# Patient Record
Sex: Female | Born: 1965 | Race: White | Hispanic: Yes | Marital: Married | Smoking: Never smoker
Health system: Southern US, Community
[De-identification: ages and names within clinical notes are randomized; demographics above are authoritative.]

## PROBLEM LIST (undated history)

## (undated) DIAGNOSIS — T7840XA Allergy, unspecified, initial encounter: Secondary | ICD-10-CM

## (undated) DIAGNOSIS — D259 Leiomyoma of uterus, unspecified: Secondary | ICD-10-CM

## (undated) DIAGNOSIS — M7580 Other shoulder lesions, unspecified shoulder: Secondary | ICD-10-CM

## (undated) DIAGNOSIS — N200 Calculus of kidney: Secondary | ICD-10-CM

## (undated) DIAGNOSIS — B019 Varicella without complication: Secondary | ICD-10-CM

## (undated) HISTORY — DX: Allergy, unspecified, initial encounter: T78.40XA

## (undated) HISTORY — DX: Varicella without complication: B01.9

## (undated) HISTORY — DX: Other shoulder lesions, unspecified shoulder: M75.80

## (undated) HISTORY — DX: Leiomyoma of uterus, unspecified: D25.9

## (undated) HISTORY — PX: LIPOSUCTION: SHX10

## (undated) HISTORY — DX: Calculus of kidney: N20.0

## (undated) HISTORY — PX: DILATION AND CURETTAGE OF UTERUS: SHX78

---

## 2003-05-16 ENCOUNTER — Other Ambulatory Visit: Admission: RE | Admit: 2003-05-16 | Discharge: 2003-05-16 | Payer: Self-pay | Admitting: Obstetrics and Gynecology

## 2004-05-30 ENCOUNTER — Other Ambulatory Visit: Admission: RE | Admit: 2004-05-30 | Discharge: 2004-05-30 | Payer: Self-pay | Admitting: Obstetrics and Gynecology

## 2006-07-15 ENCOUNTER — Emergency Department (HOSPITAL_COMMUNITY): Admission: EM | Admit: 2006-07-15 | Discharge: 2006-07-15 | Payer: Self-pay | Admitting: Emergency Medicine

## 2009-05-20 ENCOUNTER — Ambulatory Visit (HOSPITAL_COMMUNITY): Admission: RE | Admit: 2009-05-20 | Discharge: 2009-05-20 | Payer: Self-pay | Admitting: Obstetrics and Gynecology

## 2013-09-28 ENCOUNTER — Ambulatory Visit: Payer: Self-pay | Admitting: Family Medicine

## 2013-10-09 ENCOUNTER — Ambulatory Visit (INDEPENDENT_AMBULATORY_CARE_PROVIDER_SITE_OTHER): Payer: BC Managed Care – PPO | Admitting: Family Medicine

## 2013-10-09 ENCOUNTER — Encounter: Payer: Self-pay | Admitting: Family Medicine

## 2013-10-09 VITALS — BP 106/70 | HR 88 | Temp 98.6°F | Ht 61.5 in | Wt 116.6 lb

## 2013-10-09 DIAGNOSIS — Z23 Encounter for immunization: Secondary | ICD-10-CM

## 2013-10-09 DIAGNOSIS — Z0000 Encounter for general adult medical examination without abnormal findings: Secondary | ICD-10-CM

## 2013-10-09 NOTE — Progress Notes (Signed)
Pre visit review using our clinic review tool, if applicable. No additional management support is needed unless otherwise documented below in the visit note. 

## 2013-10-09 NOTE — Progress Notes (Signed)
Subjective:     Emily Kirby is a 48 y.o. female and is here for a comprehensive physical exam. The patient reports no problems.  History   Social History  . Marital Status: Married    Spouse Name: N/A    Number of Children: N/A  . Years of Education: N/A   Occupational History  .      court interpreter   Social History Main Topics  . Smoking status: Never Smoker   . Smokeless tobacco: Not on file  . Alcohol Use: Yes  . Drug Use: No  . Sexual Activity: Yes    Partners: Male   Other Topics Concern  . Not on file   Social History Narrative  . No narrative on file   There are no preventive care reminders to display for this patient.  The following portions of the patient's history were reviewed and updated as appropriate:  She  has a past medical history of Chicken pox; Kidney stones; and AC (acromioclavicular) joint bone spurs. She  does not have a problem list on file. She  has past surgical history that includes Liposuction and Cesarean section. Her family history includes COPD in her father; Cancer in her father; Hearing loss in her father; Heart disease in her father; Hypertension in her mother. She  reports that she has never smoked. She does not have any smokeless tobacco history on file. She reports that she drinks alcohol. She reports that she does not use illicit drugs. She has a current medication list which includes the following prescription(s): multiple vitamins-minerals. No current outpatient prescriptions on file prior to visit.   No current facility-administered medications on file prior to visit.   She is allergic to codeine and pseudoephedrine..  Review of Systems Review of Systems  Constitutional: Negative for activity change, appetite change and fatigue.  HENT: Negative for hearing loss, congestion, tinnitus and ear discharge.  dentist q51m Eyes: Negative for visual disturbance (see optho q1y -- vision corrected to 20/20 with glasses).   Respiratory: Negative for cough, chest tightness and shortness of breath.   Cardiovascular: Negative for chest pain, palpitations and leg swelling.  Gastrointestinal: Negative for abdominal pain, diarrhea, constipation and abdominal distention.  Genitourinary: Negative for urgency, frequency, decreased urine volume and difficulty urinating.  Musculoskeletal: Negative for back pain, arthralgias and gait problem.  Skin: Negative for color change, pallor and rash.  Neurological: Negative for dizziness, light-headedness, numbness and headaches.  Hematological: Negative for adenopathy. Does not bruise/bleed easily.  Psychiatric/Behavioral: Negative for suicidal ideas, confusion, sleep disturbance, self-injury, dysphoric mood, decreased concentration and agitation.       Objective:    BP 106/70  Pulse 88  Temp(Src) 98.6 F (37 C) (Oral)  Ht 5' 1.5" (1.562 m)  Wt 116 lb 9.6 oz (52.889 kg)  BMI 21.68 kg/m2  SpO2 96% General appearance: alert, cooperative, appears stated age and no distress Head: Normocephalic, without obvious abnormality, atraumatic Eyes: conjunctivae/corneas clear. PERRL, EOM's intact. Fundi benign. Ears: normal TM's and external ear canals both ears Nose: Nares normal. Septum midline. Mucosa normal. No drainage or sinus tenderness. Throat: lips, mucosa, and tongue normal; teeth and gums normal Neck: no adenopathy, no carotid bruit, no JVD, supple, symmetrical, trachea midline and thyroid not enlarged, symmetric, no tenderness/mass/nodules Back: symmetric, no curvature. ROM normal. No CVA tenderness. Lungs: clear to auscultation bilaterally Breasts: gyn Heart: regular rate and rhythm, S1, S2 normal, no murmur, click, rub or gallop Abdomen: s/p liposuction-- still tender , healing well Pelvic: deferred--gyn  Extremities: extremities normal, atraumatic, no cyanosis or edema Pulses: 2+ and symmetric Skin: Skin color, texture, turgor normal. No rashes or lesions Lymph  nodes: Cervical, supraclavicular, and axillary nodes normal. Neurologic: Alert and oriented X 3, normal strength and tone. Normal symmetric reflexes. Normal coordination and gait Psych-- no depression, no anxiety      Assessment:    Healthy female exam.      Plan:    ghm utd--- Tdap given before See After Visit Summary for Counseling Recommendations   1. Preventative health care  - Basic metabolic panel; Future - CBC with Differential; Future - Hepatic function panel; Future - Lipid panel; Future - POCT urinalysis dipstick; Future - TSH; Future

## 2013-10-09 NOTE — Patient Instructions (Signed)
Preventive Care for Adults A healthy lifestyle and preventive care can promote health and wellness. Preventive health guidelines for women include the following key practices.  A routine yearly physical is a good way to check with your health care provider about your health and preventive screening. It is a chance to share any concerns and updates on your health and to receive a thorough exam.  Visit your dentist for a routine exam and preventive care every 6 months. Brush your teeth twice a day and floss once a day. Good oral hygiene prevents tooth decay and gum disease.  The frequency of eye exams is based on your age, health, family medical history, use of contact lenses, and other factors. Follow your health care provider's recommendations for frequency of eye exams.  Eat a healthy diet. Foods like vegetables, fruits, whole grains, low-fat dairy products, and lean protein foods contain the nutrients you need without too many calories. Decrease your intake of foods high in solid fats, added sugars, and salt. Eat the right amount of calories for you.Get information about a proper diet from your health care provider, if necessary.  Regular physical exercise is one of the most important things you can do for your health. Most adults should get at least 150 minutes of moderate-intensity exercise (any activity that increases your heart rate and causes you to sweat) each week. In addition, most adults need muscle-strengthening exercises on 2 or more days a week.  Maintain a healthy weight. The body mass index (BMI) is a screening tool to identify possible weight problems. It provides an estimate of body fat based on height and weight. Your health care provider can find your BMI and can help you achieve or maintain a healthy weight.For adults 20 years and older:  A BMI below 18.5 is considered underweight.  A BMI of 18.5 to 24.9 is normal.  A BMI of 25 to 29.9 is considered overweight.  A BMI of  30 and above is considered obese.  Maintain normal blood lipids and cholesterol levels by exercising and minimizing your intake of saturated fat. Eat a balanced diet with plenty of fruit and vegetables. Blood tests for lipids and cholesterol should begin at age 76 and be repeated every 5 years. If your lipid or cholesterol levels are high, you are over 50, or you are at high risk for heart disease, you may need your cholesterol levels checked more frequently.Ongoing high lipid and cholesterol levels should be treated with medicines if diet and exercise are not working.  If you smoke, find out from your health care provider how to quit. If you do not use tobacco, do not start.  Lung cancer screening is recommended for adults aged 22-80 years who are at high risk for developing lung cancer because of a history of smoking. A yearly low-dose CT scan of the lungs is recommended for people who have at least a 30-pack-year history of smoking and are a current smoker or have quit within the past 15 years. A pack year of smoking is smoking an average of 1 pack of cigarettes a day for 1 year (for example: 1 pack a day for 30 years or 2 packs a day for 15 years). Yearly screening should continue until the smoker has stopped smoking for at least 15 years. Yearly screening should be stopped for people who develop a health problem that would prevent them from having lung cancer treatment.  If you are pregnant, do not drink alcohol. If you are breastfeeding,  be very cautious about drinking alcohol. If you are not pregnant and choose to drink alcohol, do not have more than 1 drink per day. One drink is considered to be 12 ounces (355 mL) of beer, 5 ounces (148 mL) of wine, or 1.5 ounces (44 mL) of liquor.  Avoid use of street drugs. Do not share needles with anyone. Ask for help if you need support or instructions about stopping the use of drugs.  High blood pressure causes heart disease and increases the risk of  stroke. Your blood pressure should be checked at least every 1 to 2 years. Ongoing high blood pressure should be treated with medicines if weight loss and exercise do not work.  If you are 3-86 years old, ask your health care provider if you should take aspirin to prevent strokes.  Diabetes screening involves taking a blood sample to check your fasting blood sugar level. This should be done once every 3 years, after age 67, if you are within normal weight and without risk factors for diabetes. Testing should be considered at a younger age or be carried out more frequently if you are overweight and have at least 1 risk factor for diabetes.  Breast cancer screening is essential preventive care for women. You should practice "breast self-awareness." This means understanding the normal appearance and feel of your breasts and may include breast self-examination. Any changes detected, no matter how small, should be reported to a health care provider. Women in their 8s and 30s should have a clinical breast exam (CBE) by a health care provider as part of a regular health exam every 1 to 3 years. After age 70, women should have a CBE every year. Starting at age 25, women should consider having a mammogram (breast X-ray test) every year. Women who have a family history of breast cancer should talk to their health care provider about genetic screening. Women at a high risk of breast cancer should talk to their health care providers about having an MRI and a mammogram every year.  Breast cancer gene (BRCA)-related cancer risk assessment is recommended for women who have family members with BRCA-related cancers. BRCA-related cancers include breast, ovarian, tubal, and peritoneal cancers. Having family members with these cancers may be associated with an increased risk for harmful changes (mutations) in the breast cancer genes BRCA1 and BRCA2. Results of the assessment will determine the need for genetic counseling and  BRCA1 and BRCA2 testing.  Routine pelvic exams to screen for cancer are no longer recommended for nonpregnant women who are considered low risk for cancer of the pelvic organs (ovaries, uterus, and vagina) and who do not have symptoms. Ask your health care provider if a screening pelvic exam is right for you.  If you have had past treatment for cervical cancer or a condition that could lead to cancer, you need Pap tests and screening for cancer for at least 20 years after your treatment. If Pap tests have been discontinued, your risk factors (such as having a new sexual partner) need to be reassessed to determine if screening should be resumed. Some women have medical problems that increase the chance of getting cervical cancer. In these cases, your health care provider may recommend more frequent screening and Pap tests.  The HPV test is an additional test that may be used for cervical cancer screening. The HPV test looks for the virus that can cause the cell changes on the cervix. The cells collected during the Pap test can be  tested for HPV. The HPV test could be used to screen women aged 30 years and older, and should be used in women of any age who have unclear Pap test results. After the age of 30, women should have HPV testing at the same frequency as a Pap test.  Colorectal cancer can be detected and often prevented. Most routine colorectal cancer screening begins at the age of 50 years and continues through age 75 years. However, your health care provider may recommend screening at an earlier age if you have risk factors for colon cancer. On a yearly basis, your health care provider may provide home test kits to check for hidden blood in the stool. Use of a small camera at the end of a tube, to directly examine the colon (sigmoidoscopy or colonoscopy), can detect the earliest forms of colorectal cancer. Talk to your health care provider about this at age 50, when routine screening begins. Direct  exam of the colon should be repeated every 5-10 years through age 75 years, unless early forms of pre-cancerous polyps or small growths are found.  People who are at an increased risk for hepatitis B should be screened for this virus. You are considered at high risk for hepatitis B if:  You were born in a country where hepatitis B occurs often. Talk with your health care provider about which countries are considered high risk.  Your parents were born in a high-risk country and you have not received a shot to protect against hepatitis B (hepatitis B vaccine).  You have HIV or AIDS.  You use needles to inject street drugs.  You live with, or have sex with, someone who has hepatitis B.  You get hemodialysis treatment.  You take certain medicines for conditions like cancer, organ transplantation, and autoimmune conditions.  Hepatitis C blood testing is recommended for all people born from 1945 through 1965 and any individual with known risks for hepatitis C.  Practice safe sex. Use condoms and avoid high-risk sexual practices to reduce the spread of sexually transmitted infections (STIs). STIs include gonorrhea, chlamydia, syphilis, trichomonas, herpes, HPV, and human immunodeficiency virus (HIV). Herpes, HIV, and HPV are viral illnesses that have no cure. They can result in disability, cancer, and death.  You should be screened for sexually transmitted illnesses (STIs) including gonorrhea and chlamydia if:  You are sexually active and are younger than 24 years.  You are older than 24 years and your health care provider tells you that you are at risk for this type of infection.  Your sexual activity has changed since you were last screened and you are at an increased risk for chlamydia or gonorrhea. Ask your health care provider if you are at risk.  If you are at risk of being infected with HIV, it is recommended that you take a prescription medicine daily to prevent HIV infection. This is  called preexposure prophylaxis (PrEP). You are considered at risk if:  You are a heterosexual woman, are sexually active, and are at increased risk for HIV infection.  You take drugs by injection.  You are sexually active with a partner who has HIV.  Talk with your health care provider about whether you are at high risk of being infected with HIV. If you choose to begin PrEP, you should first be tested for HIV. You should then be tested every 3 months for as long as you are taking PrEP.  Osteoporosis is a disease in which the bones lose minerals and strength   with aging. This can result in serious bone fractures or breaks. The risk of osteoporosis can be identified using a bone density scan. Women ages 65 years and over and women at risk for fractures or osteoporosis should discuss screening with their health care providers. Ask your health care provider whether you should take a calcium supplement or vitamin D to reduce the rate of osteoporosis.  Menopause can be associated with physical symptoms and risks. Hormone replacement therapy is available to decrease symptoms and risks. You should talk to your health care provider about whether hormone replacement therapy is right for you.  Use sunscreen. Apply sunscreen liberally and repeatedly throughout the day. You should seek shade when your shadow is shorter than you. Protect yourself by wearing long sleeves, pants, a wide-brimmed hat, and sunglasses year round, whenever you are outdoors.  Once a month, do a whole body skin exam, using a mirror to look at the skin on your back. Tell your health care provider of new moles, moles that have irregular borders, moles that are larger than a pencil eraser, or moles that have changed in shape or color.  Stay current with required vaccines (immunizations).  Influenza vaccine. All adults should be immunized every year.  Tetanus, diphtheria, and acellular pertussis (Td, Tdap) vaccine. Pregnant women should  receive 1 dose of Tdap vaccine during each pregnancy. The dose should be obtained regardless of the length of time since the last dose. Immunization is preferred during the 27th-36th week of gestation. An adult who has not previously received Tdap or who does not know her vaccine status should receive 1 dose of Tdap. This initial dose should be followed by tetanus and diphtheria toxoids (Td) booster doses every 10 years. Adults with an unknown or incomplete history of completing a 3-dose immunization series with Td-containing vaccines should begin or complete a primary immunization series including a Tdap dose. Adults should receive a Td booster every 10 years.  Varicella vaccine. An adult without evidence of immunity to varicella should receive 2 doses or a second dose if she has previously received 1 dose. Pregnant females who do not have evidence of immunity should receive the first dose after pregnancy. This first dose should be obtained before leaving the health care facility. The second dose should be obtained 4-8 weeks after the first dose.  Human papillomavirus (HPV) vaccine. Females aged 13-26 years who have not received the vaccine previously should obtain the 3-dose series. The vaccine is not recommended for use in pregnant females. However, pregnancy testing is not needed before receiving a dose. If a female is found to be pregnant after receiving a dose, no treatment is needed. In that case, the remaining doses should be delayed until after the pregnancy. Immunization is recommended for any person with an immunocompromised condition through the age of 26 years if she did not get any or all doses earlier. During the 3-dose series, the second dose should be obtained 4-8 weeks after the first dose. The third dose should be obtained 24 weeks after the first dose and 16 weeks after the second dose.  Zoster vaccine. One dose is recommended for adults aged 60 years or older unless certain conditions are  present.  Measles, mumps, and rubella (MMR) vaccine. Adults born before 1957 generally are considered immune to measles and mumps. Adults born in 1957 or later should have 1 or more doses of MMR vaccine unless there is a contraindication to the vaccine or there is laboratory evidence of immunity to   each of the three diseases. A routine second dose of MMR vaccine should be obtained at least 28 days after the first dose for students attending postsecondary schools, health care workers, or international travelers. People who received inactivated measles vaccine or an unknown type of measles vaccine during 1963-1967 should receive 2 doses of MMR vaccine. People who received inactivated mumps vaccine or an unknown type of mumps vaccine before 1979 and are at high risk for mumps infection should consider immunization with 2 doses of MMR vaccine. For females of childbearing age, rubella immunity should be determined. If there is no evidence of immunity, females who are not pregnant should be vaccinated. If there is no evidence of immunity, females who are pregnant should delay immunization until after pregnancy. Unvaccinated health care workers born before 1957 who lack laboratory evidence of measles, mumps, or rubella immunity or laboratory confirmation of disease should consider measles and mumps immunization with 2 doses of MMR vaccine or rubella immunization with 1 dose of MMR vaccine.  Pneumococcal 13-valent conjugate (PCV13) vaccine. When indicated, a person who is uncertain of her immunization history and has no record of immunization should receive the PCV13 vaccine. An adult aged 19 years or older who has certain medical conditions and has not been previously immunized should receive 1 dose of PCV13 vaccine. This PCV13 should be followed with a dose of pneumococcal polysaccharide (PPSV23) vaccine. The PPSV23 vaccine dose should be obtained at least 8 weeks after the dose of PCV13 vaccine. An adult aged 19  years or older who has certain medical conditions and previously received 1 or more doses of PPSV23 vaccine should receive 1 dose of PCV13. The PCV13 vaccine dose should be obtained 1 or more years after the last PPSV23 vaccine dose.  Pneumococcal polysaccharide (PPSV23) vaccine. When PCV13 is also indicated, PCV13 should be obtained first. All adults aged 65 years and older should be immunized. An adult younger than age 65 years who has certain medical conditions should be immunized. Any person who resides in a nursing home or long-term care facility should be immunized. An adult smoker should be immunized. People with an immunocompromised condition and certain other conditions should receive both PCV13 and PPSV23 vaccines. People with human immunodeficiency virus (HIV) infection should be immunized as soon as possible after diagnosis. Immunization during chemotherapy or radiation therapy should be avoided. Routine use of PPSV23 vaccine is not recommended for American Indians, [place] Natives, or people younger than 65 years unless there are medical conditions that require PPSV23 vaccine. When indicated, people who have unknown immunization and have no record of immunization should receive PPSV23 vaccine. One-time revaccination 5 years after the first dose of PPSV23 is recommended for people aged 19-64 years who have chronic kidney failure, nephrotic syndrome, asplenia, or immunocompromised conditions. People who received 1-2 doses of PPSV23 before age 65 years should receive another dose of PPSV23 vaccine at age 65 years or later if at least 5 years have passed since the previous dose. Doses of PPSV23 are not needed for people immunized with PPSV23 at or after age 65 years.  Meningococcal vaccine. Adults with asplenia or persistent complement component deficiencies should receive 2 doses of quadrivalent meningococcal conjugate (MenACWY-D) vaccine. The doses should be obtained at least 2 months apart.  Microbiologists working with certain meningococcal bacteria, military recruits, people at risk during an outbreak, and people who travel to or live in countries with a high rate of meningitis should be immunized. A first-year college student up through age   21 years who is living in a residence hall should receive a dose if she did not receive a dose on or after her 16th birthday. Adults who have certain high-risk conditions should receive one or more doses of vaccine.  Hepatitis A vaccine. Adults who wish to be protected from this disease, have certain high-risk conditions, work with hepatitis A-infected animals, work in hepatitis A research labs, or travel to or work in countries with a high rate of hepatitis A should be immunized. Adults who were previously unvaccinated and who anticipate close contact with an international adoptee during the first 60 days after arrival in the [place] from a country with a high rate of hepatitis A should be immunized.  Hepatitis B vaccine. Adults who wish to be protected from this disease, have certain high-risk conditions, may be exposed to blood or other infectious body fluids, are household contacts or sex partners of hepatitis B positive people, are clients or workers in certain care facilities, or travel to or work in countries with a high rate of hepatitis B should be immunized.  Haemophilus influenzae type b (Hib) vaccine. A previously unvaccinated person with asplenia or sickle cell disease or having a scheduled splenectomy should receive 1 dose of Hib vaccine. Regardless of previous immunization, a recipient of a hematopoietic stem cell transplant should receive a 3-dose series 6-12 months after her successful transplant. Hib vaccine is not recommended for adults with HIV infection. Preventive Services / Frequency Ages 64 to 68 years  Blood pressure check.** / Every 1 to 2 years.  Lipid and cholesterol check.** / Every 5 years beginning at age  22.  Clinical breast exam.** / Every 3 years for women in their 88s and 53s.  BRCA-related cancer risk assessment.** / For women who have family members with a BRCA-related cancer (breast, ovarian, tubal, or peritoneal cancers).  Pap test.** / Every 2 years from ages 90 through 51. Every 3 years starting at age 21 through age 56 or 3 with a history of 3 consecutive normal Pap tests.  HPV screening.** / Every 3 years from ages 24 through ages 1 to 46 with a history of 3 consecutive normal Pap tests.  Hepatitis C blood test.** / For any individual with known risks for hepatitis C.  Skin self-exam. / Monthly.  Influenza vaccine. / Every year.  Tetanus, diphtheria, and acellular pertussis (Tdap, Td) vaccine.** / Consult your health care provider. Pregnant women should receive 1 dose of Tdap vaccine during each pregnancy. 1 dose of Td every 10 years.  Varicella vaccine.** / Consult your health care provider. Pregnant females who do not have evidence of immunity should receive the first dose after pregnancy.  HPV vaccine. / 3 doses over 6 months, if 72 and younger. The vaccine is not recommended for use in pregnant females. However, pregnancy testing is not needed before receiving a dose.  Measles, mumps, rubella (MMR) vaccine.** / You need at least 1 dose of MMR if you were born in 1957 or later. You may also need a 2nd dose. For females of childbearing age, rubella immunity should be determined. If there is no evidence of immunity, females who are not pregnant should be vaccinated. If there is no evidence of immunity, females who are pregnant should delay immunization until after pregnancy.  Pneumococcal 13-valent conjugate (PCV13) vaccine.** / Consult your health care provider.  Pneumococcal polysaccharide (PPSV23) vaccine.** / 1 to 2 doses if you smoke cigarettes or if you have certain conditions.  Meningococcal vaccine.** /  1 dose if you are age 19 to 21 years and a first-year college  student living in a residence hall, or have one of several medical conditions, you need to get vaccinated against meningococcal disease. You may also need additional booster doses.  Hepatitis A vaccine.** / Consult your health care provider.  Hepatitis B vaccine.** / Consult your health care provider.  Haemophilus influenzae type b (Hib) vaccine.** / Consult your health care provider. Ages 40 to 64 years  Blood pressure check.** / Every 1 to 2 years.  Lipid and cholesterol check.** / Every 5 years beginning at age 20 years.  Lung cancer screening. / Every year if you are aged 55-80 years and have a 30-pack-year history of smoking and currently smoke or have quit within the past 15 years. Yearly screening is stopped once you have quit smoking for at least 15 years or develop a health problem that would prevent you from having lung cancer treatment.  Clinical breast exam.** / Every year after age 40 years.  BRCA-related cancer risk assessment.** / For women who have family members with a BRCA-related cancer (breast, ovarian, tubal, or peritoneal cancers).  Mammogram.** / Every year beginning at age 40 years and continuing for as long as you are in good health. Consult with your health care provider.  Pap test.** / Every 3 years starting at age 30 years through age 65 or 70 years with a history of 3 consecutive normal Pap tests.  HPV screening.** / Every 3 years from ages 30 years through ages 65 to 70 years with a history of 3 consecutive normal Pap tests.  Fecal occult blood test (FOBT) of stool. / Every year beginning at age 50 years and continuing until age 75 years. You may not need to do this test if you get a colonoscopy every 10 years.  Flexible sigmoidoscopy or colonoscopy.** / Every 5 years for a flexible sigmoidoscopy or every 10 years for a colonoscopy beginning at age 50 years and continuing until age 75 years.  Hepatitis C blood test.** / For all people born from 1945 through  1965 and any individual with known risks for hepatitis C.  Skin self-exam. / Monthly.  Influenza vaccine. / Every year.  Tetanus, diphtheria, and acellular pertussis (Tdap/Td) vaccine.** / Consult your health care provider. Pregnant women should receive 1 dose of Tdap vaccine during each pregnancy. 1 dose of Td every 10 years.  Varicella vaccine.** / Consult your health care provider. Pregnant females who do not have evidence of immunity should receive the first dose after pregnancy.  Zoster vaccine.** / 1 dose for adults aged 60 years or older.  Measles, mumps, rubella (MMR) vaccine.** / You need at least 1 dose of MMR if you were born in 1957 or later. You may also need a 2nd dose. For females of childbearing age, rubella immunity should be determined. If there is no evidence of immunity, females who are not pregnant should be vaccinated. If there is no evidence of immunity, females who are pregnant should delay immunization until after pregnancy.  Pneumococcal 13-valent conjugate (PCV13) vaccine.** / Consult your health care provider.  Pneumococcal polysaccharide (PPSV23) vaccine.** / 1 to 2 doses if you smoke cigarettes or if you have certain conditions.  Meningococcal vaccine.** / Consult your health care provider.  Hepatitis A vaccine.** / Consult your health care provider.  Hepatitis B vaccine.** / Consult your health care provider.  Haemophilus influenzae type b (Hib) vaccine.** / Consult your health care provider. Ages 65   years and over  Blood pressure check.** / Every 1 to 2 years.  Lipid and cholesterol check.** / Every 5 years beginning at age 22 years.  Lung cancer screening. / Every year if you are aged 73-80 years and have a 30-pack-year history of smoking and currently smoke or have quit within the past 15 years. Yearly screening is stopped once you have quit smoking for at least 15 years or develop a health problem that would prevent you from having lung cancer  treatment.  Clinical breast exam.** / Every year after age 4 years.  BRCA-related cancer risk assessment.** / For women who have family members with a BRCA-related cancer (breast, ovarian, tubal, or peritoneal cancers).  Mammogram.** / Every year beginning at age 40 years and continuing for as long as you are in good health. Consult with your health care provider.  Pap test.** / Every 3 years starting at age 9 years through age 34 or 91 years with 3 consecutive normal Pap tests. Testing can be stopped between 65 and 70 years with 3 consecutive normal Pap tests and no abnormal Pap or HPV tests in the past 10 years.  HPV screening.** / Every 3 years from ages 57 years through ages 64 or 45 years with a history of 3 consecutive normal Pap tests. Testing can be stopped between 65 and 70 years with 3 consecutive normal Pap tests and no abnormal Pap or HPV tests in the past 10 years.  Fecal occult blood test (FOBT) of stool. / Every year beginning at age 15 years and continuing until age 17 years. You may not need to do this test if you get a colonoscopy every 10 years.  Flexible sigmoidoscopy or colonoscopy.** / Every 5 years for a flexible sigmoidoscopy or every 10 years for a colonoscopy beginning at age 86 years and continuing until age 71 years.  Hepatitis C blood test.** / For all people born from [birth year] through 1965 and any individual with known risks for hepatitis C.  Osteoporosis screening.** / A one-time screening for women ages 83 years and over and women at risk for fractures or osteoporosis.  Skin self-exam. / Monthly.  Influenza vaccine. / Every year.  Tetanus, diphtheria, and acellular pertussis (Tdap/Td) vaccine.** / 1 dose of Td every 10 years.  Varicella vaccine.** / Consult your health care provider.  Zoster vaccine.** / 1 dose for adults aged 61 years or older.  Pneumococcal 13-valent conjugate (PCV13) vaccine.** / Consult your health care provider.  Pneumococcal  polysaccharide (PPSV23) vaccine.** / 1 dose for all adults aged 28 years and older.  Meningococcal vaccine.** / Consult your health care provider.  Hepatitis A vaccine.** / Consult your health care provider.  Hepatitis B vaccine.** / Consult your health care provider.  Haemophilus influenzae type b (Hib) vaccine.** / Consult your health care provider. ** Family history and personal history of risk and conditions may change your health care provider's recommendations. Document Released: 04/21/2001 Document Revised: 07/10/2013 Document Reviewed: 07/21/2010 [facility] Patient Information 2015 [place], [place]. This information is not intended to replace advice given to you by your health care provider. Make sure you discuss any questions you have with your health care provider.

## 2013-10-12 ENCOUNTER — Other Ambulatory Visit (INDEPENDENT_AMBULATORY_CARE_PROVIDER_SITE_OTHER): Payer: BC Managed Care – PPO

## 2013-10-12 DIAGNOSIS — Z0000 Encounter for general adult medical examination without abnormal findings: Secondary | ICD-10-CM

## 2013-10-12 LAB — CBC WITH DIFFERENTIAL/PLATELET
BASOS ABS: 0 10*3/uL (ref 0.0–0.1)
BASOS PCT: 0.5 % (ref 0.0–3.0)
EOS ABS: 0.1 10*3/uL (ref 0.0–0.7)
EOS PCT: 1.9 % (ref 0.0–5.0)
HEMATOCRIT: 40.5 % (ref 36.0–46.0)
HEMOGLOBIN: 13.4 g/dL (ref 12.0–15.0)
LYMPHS PCT: 30.7 % (ref 12.0–46.0)
Lymphs Abs: 1.3 10*3/uL (ref 0.7–4.0)
MCHC: 33.1 g/dL (ref 30.0–36.0)
MCV: 83.8 fl (ref 78.0–100.0)
MONO ABS: 0.3 10*3/uL (ref 0.1–1.0)
Monocytes Relative: 7.6 % (ref 3.0–12.0)
NEUTROS ABS: 2.5 10*3/uL (ref 1.4–7.7)
NEUTROS PCT: 59.3 % (ref 43.0–77.0)
PLATELETS: 254 10*3/uL (ref 150.0–400.0)
RBC: 4.84 Mil/uL (ref 3.87–5.11)
RDW: 13.6 % (ref 11.5–15.5)
WBC: 4.1 10*3/uL (ref 4.0–10.5)

## 2013-10-12 LAB — LIPID PANEL
CHOL/HDL RATIO: 4
CHOLESTEROL: 152 mg/dL (ref 0–200)
HDL: 42.8 mg/dL (ref >39.00)
LDL CALC: 99 mg/dL (ref 0–99)
NONHDL: 109.2
Triglycerides: 50 mg/dL (ref 0.0–149.0)
VLDL: 10 mg/dL (ref 0.0–40.0)

## 2013-10-12 LAB — HEPATIC FUNCTION PANEL
ALT: 15 U/L (ref 0–35)
AST: 17 U/L (ref 0–37)
Albumin: 4.2 g/dL (ref 3.5–5.2)
Alkaline Phosphatase: 47 U/L (ref 39–117)
BILIRUBIN TOTAL: 0.4 mg/dL (ref 0.2–1.2)
Bilirubin, Direct: 0 mg/dL (ref 0.0–0.3)
TOTAL PROTEIN: 6.9 g/dL (ref 6.0–8.3)

## 2013-10-12 LAB — BASIC METABOLIC PANEL
BUN: 8 mg/dL (ref 6–23)
CALCIUM: 9.2 mg/dL (ref 8.4–10.5)
CHLORIDE: 106 meq/L (ref 96–112)
CO2: 25 meq/L (ref 19–32)
Creatinine, Ser: 0.7 mg/dL (ref 0.4–1.2)
GFR: 94.89 mL/min (ref >60.00)
Glucose, Bld: 83 mg/dL (ref 70–99)
Potassium: 3.9 mEq/L (ref 3.5–5.1)
SODIUM: 138 meq/L (ref 135–145)

## 2013-10-12 LAB — TSH: TSH: 1.07 u[IU]/mL (ref 0.35–4.50)

## 2013-10-13 LAB — POCT URINALYSIS DIPSTICK
Bilirubin, UA: NEGATIVE
Blood, UA: NEGATIVE
GLUCOSE UA: NEGATIVE
KETONES UA: NEGATIVE
Leukocytes, UA: NEGATIVE
Nitrite, UA: NEGATIVE
PH UA: 7.5
Protein, UA: NEGATIVE
Spec Grav, UA: 1.01
UROBILINOGEN UA: 0.2

## 2014-09-24 ENCOUNTER — Telehealth: Payer: Self-pay | Admitting: Family Medicine

## 2014-09-24 NOTE — Telephone Encounter (Signed)
pre visit letter mailed 09/20/14

## 2014-10-04 LAB — HM PAP SMEAR: HM Pap smear: NORMAL

## 2014-10-04 LAB — HM MAMMOGRAPHY: HM MAMMO: NORMAL

## 2014-10-10 ENCOUNTER — Telehealth: Payer: Self-pay | Admitting: *Deleted

## 2014-10-10 ENCOUNTER — Encounter: Payer: Self-pay | Admitting: *Deleted

## 2014-10-10 NOTE — Addendum Note (Signed)
Addended by: Leticia Penna A on: 10/10/2014 11:27 AM   Modules accepted: Medications

## 2014-10-10 NOTE — Telephone Encounter (Signed)
Pre-Visit Call completed with patient and chart updated.   Pre-Visit Info documented in Specialty Comments under [employer].    

## 2014-10-10 NOTE — Telephone Encounter (Signed)
Unable to reach patient at time of Pre-Visit Call.  Left message for patient to return call when available.    

## 2014-10-11 ENCOUNTER — Ambulatory Visit (INDEPENDENT_AMBULATORY_CARE_PROVIDER_SITE_OTHER): Payer: BLUE CROSS/BLUE SHIELD | Admitting: Family Medicine

## 2014-10-11 ENCOUNTER — Encounter: Payer: Self-pay | Admitting: Family Medicine

## 2014-10-11 VITALS — BP 100/68 | HR 65 | Temp 98.6°F | Ht 62.0 in | Wt 105.2 lb

## 2014-10-11 DIAGNOSIS — Z0000 Encounter for general adult medical examination without abnormal findings: Secondary | ICD-10-CM

## 2014-10-11 NOTE — Progress Notes (Signed)
Subjective:     Emily Kirby is a 49 y.o. female and is here for a comprehensive physical exam. The patient reports problems - dark spots on knee since being at beach.  History   Social History  . Marital Status: Married    Spouse Name: N/A  . Number of Children: N/A  . Years of Education: N/A   Occupational History  .      court interpreter   Social History Main Topics  . Smoking status: Never Smoker   . Smokeless tobacco: Not on file  . Alcohol Use: Yes  . Drug Use: No  . Sexual Activity:    Partners: Male   Other Topics Concern  . Not on file   Social History Narrative   Health Maintenance  Topic Date Due  . INFLUENZA VACCINE  12/11/2014 (Originally 10/08/2014)  . HIV Screening  10/11/2015 (Originally 09/11/1980)  . MAMMOGRAM  10/04/2015  . PAP SMEAR  10/03/2017  . TETANUS/TDAP  10/10/2023    The following portions of the patient's history were reviewed and updated as appropriate:  She  has a past medical history of Chicken pox; Kidney stones; and AC (acromioclavicular) joint bone spurs. She  does not have a problem list on file. She  has past surgical history that includes Liposuction and Cesarean section. Her family history includes COPD in her father; Cancer in her father; Hearing loss in her father; Heart disease in her father; Hypertension in her mother. She  reports that she has never smoked. She does not have any smokeless tobacco history on file. She reports that she drinks alcohol. She reports that she does not use illicit drugs. She currently has no medications in their medication list. No current outpatient prescriptions on file prior to visit.   No current facility-administered medications on file prior to visit.   She is allergic to codeine and pseudoephedrine..  Review of Systems  Review of Systems  Constitutional: Negative for activity change, appetite change and fatigue.  HENT: Negative for hearing loss, congestion, tinnitus and ear discharge.   dentist q48m Eyes: Negative for visual disturbance (see optho q1y -- vision corrected to 20/20 with glasses).  Respiratory: Negative for cough, chest tightness and shortness of breath.   Cardiovascular: Negative for chest pain, palpitations and leg swelling.  Gastrointestinal: Negative for abdominal pain, diarrhea, constipation and abdominal distention.  Genitourinary: Negative for urgency, frequency, decreased urine volume and difficulty urinating.  Musculoskeletal: Negative for back pain, arthralgias and gait problem.  Skin: Negative for color change, pallor and rash.  Neurological: Negative for dizziness, light-headedness, numbness and headaches.  Hematological: Negative for adenopathy. Does not bruise/bleed easily.  Psychiatric/Behavioral: Negative for suicidal ideas, confusion, sleep disturbance, self-injury, dysphoric mood, decreased concentration and agitation.      Objective:    BP 100/68 mmHg  Pulse 65  Temp(Src) 98.6 F (37 C) (Oral)  Ht 5\' 2"  (1.575 m)  Wt 105 lb 3.2 oz (47.718 kg)  BMI 19.24 kg/m2  SpO2 98% General appearance: alert, cooperative, appears stated age and no distress Head: Normocephalic, without obvious abnormality, atraumatic Eyes: conjunctivae/corneas clear. PERRL, EOM's intact. Fundi benign. Ears: normal TM's and external ear canals both ears Nose: Nares normal. Septum midline. Mucosa normal. No drainage or sinus tenderness. Throat: lips, mucosa, and tongue normal; teeth and gums normal Neck: no adenopathy, no carotid bruit, no JVD, supple, symmetrical, trachea midline and thyroid not enlarged, symmetric, no tenderness/mass/nodules Back: symmetric, no curvature. ROM normal. No CVA tenderness. Lungs: clear to auscultation bilaterally  Breasts: normal appearance, no masses or tenderness Heart: S1, S2 normal Abdomen: soft, non-tender; bowel sounds normal; no masses,  no organomegaly Pelvic: deferred Extremities: extremities normal, atraumatic, no cyanosis  or edema Pulses: 2+ and symmetric Skin: Skin color, texture, turgor normal. No rashes or lesions Lymph nodes: Cervical, supraclavicular, and axillary nodes normal. Neurologic: Alert and oriented X 3, normal strength and tone. Normal symmetric reflexes. Normal coordination and gait Psych- no depression, no anxiety      Assessment:    Healthy female exam. \      Plan:     ghm utd Check labs See After Visit Summary for Counseling Recommendations

## 2014-10-11 NOTE — Progress Notes (Signed)
Pre visit review using our clinic review tool, if applicable. No additional management support is needed unless otherwise documented below in the visit note. 

## 2014-10-11 NOTE — Patient Instructions (Signed)
Preventive Care for Adults A healthy lifestyle and preventive care can promote health and wellness. Preventive health guidelines for women include the following key practices.  A routine yearly physical is a good way to check with your health care provider about your health and preventive screening. It is a chance to share any concerns and updates on your health and to receive a thorough exam.  Visit your dentist for a routine exam and preventive care every 6 months. Brush your teeth twice a day and floss once a day. Good oral hygiene prevents tooth decay and gum disease.  The frequency of eye exams is based on your age, health, family medical history, use of contact lenses, and other factors. Follow your health care provider's recommendations for frequency of eye exams.  Eat a healthy diet. Foods like vegetables, fruits, whole grains, low-fat dairy products, and lean protein foods contain the nutrients you need without too many calories. Decrease your intake of foods high in solid fats, added sugars, and salt. Eat the right amount of calories for you.Get information about a proper diet from your health care provider, if necessary.  Regular physical exercise is one of the most important things you can do for your health. Most adults should get at least 150 minutes of moderate-intensity exercise (any activity that increases your heart rate and causes you to sweat) each week. In addition, most adults need muscle-strengthening exercises on 2 or more days a week.  Maintain a healthy weight. The body mass index (BMI) is a screening tool to identify possible weight problems. It provides an estimate of body fat based on height and weight. Your health care provider can find your BMI and can help you achieve or maintain a healthy weight.For adults 20 years and older:  A BMI below 18.5 is considered underweight.  A BMI of 18.5 to 24.9 is normal.  A BMI of 25 to 29.9 is considered overweight.  A BMI of  30 and above is considered obese.  Maintain normal blood lipids and cholesterol levels by exercising and minimizing your intake of saturated fat. Eat a balanced diet with plenty of fruit and vegetables. Blood tests for lipids and cholesterol should begin at age 76 and be repeated every 5 years. If your lipid or cholesterol levels are high, you are over 50, or you are at high risk for heart disease, you may need your cholesterol levels checked more frequently.Ongoing high lipid and cholesterol levels should be treated with medicines if diet and exercise are not working.  If you smoke, find out from your health care provider how to quit. If you do not use tobacco, do not start.  Lung cancer screening is recommended for adults aged 22-80 years who are at high risk for developing lung cancer because of a history of smoking. A yearly low-dose CT scan of the lungs is recommended for people who have at least a 30-pack-year history of smoking and are a current smoker or have quit within the past 15 years. A pack year of smoking is smoking an average of 1 pack of cigarettes a day for 1 year (for example: 1 pack a day for 30 years or 2 packs a day for 15 years). Yearly screening should continue until the smoker has stopped smoking for at least 15 years. Yearly screening should be stopped for people who develop a health problem that would prevent them from having lung cancer treatment.  If you are pregnant, do not drink alcohol. If you are breastfeeding,  be very cautious about drinking alcohol. If you are not pregnant and choose to drink alcohol, do not have more than 1 drink per day. One drink is considered to be 12 ounces (355 mL) of beer, 5 ounces (148 mL) of wine, or 1.5 ounces (44 mL) of liquor.  Avoid use of street drugs. Do not share needles with anyone. Ask for help if you need support or instructions about stopping the use of drugs.  High blood pressure causes heart disease and increases the risk of  stroke. Your blood pressure should be checked at least every 1 to 2 years. Ongoing high blood pressure should be treated with medicines if weight loss and exercise do not work.  If you are 75-52 years old, ask your health care provider if you should take aspirin to prevent strokes.  Diabetes screening involves taking a blood sample to check your fasting blood sugar level. This should be done once every 3 years, after age 15, if you are within normal weight and without risk factors for diabetes. Testing should be considered at a younger age or be carried out more frequently if you are overweight and have at least 1 risk factor for diabetes.  Breast cancer screening is essential preventive care for women. You should practice "breast self-awareness." This means understanding the normal appearance and feel of your breasts and may include breast self-examination. Any changes detected, no matter how small, should be reported to a health care provider. Women in their 58s and 30s should have a clinical breast exam (CBE) by a health care provider as part of a regular health exam every 1 to 3 years. After age 16, women should have a CBE every year. Starting at age 53, women should consider having a mammogram (breast X-ray test) every year. Women who have a family history of breast cancer should talk to their health care provider about genetic screening. Women at a high risk of breast cancer should talk to their health care providers about having an MRI and a mammogram every year.  Breast cancer gene (BRCA)-related cancer risk assessment is recommended for women who have family members with BRCA-related cancers. BRCA-related cancers include breast, ovarian, tubal, and peritoneal cancers. Having family members with these cancers may be associated with an increased risk for harmful changes (mutations) in the breast cancer genes BRCA1 and BRCA2. Results of the assessment will determine the need for genetic counseling and  BRCA1 and BRCA2 testing.  Routine pelvic exams to screen for cancer are no longer recommended for nonpregnant women who are considered low risk for cancer of the pelvic organs (ovaries, uterus, and vagina) and who do not have symptoms. Ask your health care provider if a screening pelvic exam is right for you.  If you have had past treatment for cervical cancer or a condition that could lead to cancer, you need Pap tests and screening for cancer for at least 20 years after your treatment. If Pap tests have been discontinued, your risk factors (such as having a new sexual partner) need to be reassessed to determine if screening should be resumed. Some women have medical problems that increase the chance of getting cervical cancer. In these cases, your health care provider may recommend more frequent screening and Pap tests.  The HPV test is an additional test that may be used for cervical cancer screening. The HPV test looks for the virus that can cause the cell changes on the cervix. The cells collected during the Pap test can be  tested for HPV. The HPV test could be used to screen women aged 30 years and older, and should be used in women of any age who have unclear Pap test results. After the age of 30, women should have HPV testing at the same frequency as a Pap test.  Colorectal cancer can be detected and often prevented. Most routine colorectal cancer screening begins at the age of 50 years and continues through age 75 years. However, your health care provider may recommend screening at an earlier age if you have risk factors for colon cancer. On a yearly basis, your health care provider may provide home test kits to check for hidden blood in the stool. Use of a small camera at the end of a tube, to directly examine the colon (sigmoidoscopy or colonoscopy), can detect the earliest forms of colorectal cancer. Talk to your health care provider about this at age 50, when routine screening begins. Direct  exam of the colon should be repeated every 5-10 years through age 75 years, unless early forms of pre-cancerous polyps or small growths are found.  People who are at an increased risk for hepatitis B should be screened for this virus. You are considered at high risk for hepatitis B if:  You were born in a country where hepatitis B occurs often. Talk with your health care provider about which countries are considered high risk.  Your parents were born in a high-risk country and you have not received a shot to protect against hepatitis B (hepatitis B vaccine).  You have HIV or AIDS.  You use needles to inject street drugs.  You live with, or have sex with, someone who has hepatitis B.  You get hemodialysis treatment.  You take certain medicines for conditions like cancer, organ transplantation, and autoimmune conditions.  Hepatitis C blood testing is recommended for all people born from 1945 through 1965 and any individual with known risks for hepatitis C.  Practice safe sex. Use condoms and avoid high-risk sexual practices to reduce the spread of sexually transmitted infections (STIs). STIs include gonorrhea, chlamydia, syphilis, trichomonas, herpes, HPV, and human immunodeficiency virus (HIV). Herpes, HIV, and HPV are viral illnesses that have no cure. They can result in disability, cancer, and death.  You should be screened for sexually transmitted illnesses (STIs) including gonorrhea and chlamydia if:  You are sexually active and are younger than 24 years.  You are older than 24 years and your health care provider tells you that you are at risk for this type of infection.  Your sexual activity has changed since you were last screened and you are at an increased risk for chlamydia or gonorrhea. Ask your health care provider if you are at risk.  If you are at risk of being infected with HIV, it is recommended that you take a prescription medicine daily to prevent HIV infection. This is  called preexposure prophylaxis (PrEP). You are considered at risk if:  You are a heterosexual woman, are sexually active, and are at increased risk for HIV infection.  You take drugs by injection.  You are sexually active with a partner who has HIV.  Talk with your health care provider about whether you are at high risk of being infected with HIV. If you choose to begin PrEP, you should first be tested for HIV. You should then be tested every 3 months for as long as you are taking PrEP.  Osteoporosis is a disease in which the bones lose minerals and strength   with aging. This can result in serious bone fractures or breaks. The risk of osteoporosis can be identified using a bone density scan. Women ages 65 years and over and women at risk for fractures or osteoporosis should discuss screening with their health care providers. Ask your health care provider whether you should take a calcium supplement or vitamin D to reduce the rate of osteoporosis.  Menopause can be associated with physical symptoms and risks. Hormone replacement therapy is available to decrease symptoms and risks. You should talk to your health care provider about whether hormone replacement therapy is right for you.  Use sunscreen. Apply sunscreen liberally and repeatedly throughout the day. You should seek shade when your shadow is shorter than you. Protect yourself by wearing long sleeves, pants, a wide-brimmed hat, and sunglasses year round, whenever you are outdoors.  Once a month, do a whole body skin exam, using a mirror to look at the skin on your back. Tell your health care provider of new moles, moles that have irregular borders, moles that are larger than a pencil eraser, or moles that have changed in shape or color.  Stay current with required vaccines (immunizations).  Influenza vaccine. All adults should be immunized every year.  Tetanus, diphtheria, and acellular pertussis (Td, Tdap) vaccine. Pregnant women should  receive 1 dose of Tdap vaccine during each pregnancy. The dose should be obtained regardless of the length of time since the last dose. Immunization is preferred during the 27th-36th week of gestation. An adult who has not previously received Tdap or who does not know her vaccine status should receive 1 dose of Tdap. This initial dose should be followed by tetanus and diphtheria toxoids (Td) booster doses every 10 years. Adults with an unknown or incomplete history of completing a 3-dose immunization series with Td-containing vaccines should begin or complete a primary immunization series including a Tdap dose. Adults should receive a Td booster every 10 years.  Varicella vaccine. An adult without evidence of immunity to varicella should receive 2 doses or a second dose if she has previously received 1 dose. Pregnant females who do not have evidence of immunity should receive the first dose after pregnancy. This first dose should be obtained before leaving the health care facility. The second dose should be obtained 4-8 weeks after the first dose.  Human papillomavirus (HPV) vaccine. Females aged 13-26 years who have not received the vaccine previously should obtain the 3-dose series. The vaccine is not recommended for use in pregnant females. However, pregnancy testing is not needed before receiving a dose. If a female is found to be pregnant after receiving a dose, no treatment is needed. In that case, the remaining doses should be delayed until after the pregnancy. Immunization is recommended for any person with an immunocompromised condition through the age of 26 years if she did not get any or all doses earlier. During the 3-dose series, the second dose should be obtained 4-8 weeks after the first dose. The third dose should be obtained 24 weeks after the first dose and 16 weeks after the second dose.  Zoster vaccine. One dose is recommended for adults aged 60 years or older unless certain conditions are  present.  Measles, mumps, and rubella (MMR) vaccine. Adults born before 1957 generally are considered immune to measles and mumps. Adults born in 1957 or later should have 1 or more doses of MMR vaccine unless there is a contraindication to the vaccine or there is laboratory evidence of immunity to   each of the three diseases. A routine second dose of MMR vaccine should be obtained at least 28 days after the first dose for students attending postsecondary schools, health care workers, or international travelers. People who received inactivated measles vaccine or an unknown type of measles vaccine during 1963-1967 should receive 2 doses of MMR vaccine. People who received inactivated mumps vaccine or an unknown type of mumps vaccine before 1979 and are at high risk for mumps infection should consider immunization with 2 doses of MMR vaccine. For females of childbearing age, rubella immunity should be determined. If there is no evidence of immunity, females who are not pregnant should be vaccinated. If there is no evidence of immunity, females who are pregnant should delay immunization until after pregnancy. Unvaccinated health care workers born before 1957 who lack laboratory evidence of measles, mumps, or rubella immunity or laboratory confirmation of disease should consider measles and mumps immunization with 2 doses of MMR vaccine or rubella immunization with 1 dose of MMR vaccine.  Pneumococcal 13-valent conjugate (PCV13) vaccine. When indicated, a person who is uncertain of her immunization history and has no record of immunization should receive the PCV13 vaccine. An adult aged 19 years or older who has certain medical conditions and has not been previously immunized should receive 1 dose of PCV13 vaccine. This PCV13 should be followed with a dose of pneumococcal polysaccharide (PPSV23) vaccine. The PPSV23 vaccine dose should be obtained at least 8 weeks after the dose of PCV13 vaccine. An adult aged 19  years or older who has certain medical conditions and previously received 1 or more doses of PPSV23 vaccine should receive 1 dose of PCV13. The PCV13 vaccine dose should be obtained 1 or more years after the last PPSV23 vaccine dose.  Pneumococcal polysaccharide (PPSV23) vaccine. When PCV13 is also indicated, PCV13 should be obtained first. All adults aged 65 years and older should be immunized. An adult younger than age 65 years who has certain medical conditions should be immunized. Any person who resides in a nursing home or long-term care facility should be immunized. An adult smoker should be immunized. People with an immunocompromised condition and certain other conditions should receive both PCV13 and PPSV23 vaccines. People with human immunodeficiency virus (HIV) infection should be immunized as soon as possible after diagnosis. Immunization during chemotherapy or radiation therapy should be avoided. Routine use of PPSV23 vaccine is not recommended for American Indians, [place] Natives, or people younger than 65 years unless there are medical conditions that require PPSV23 vaccine. When indicated, people who have unknown immunization and have no record of immunization should receive PPSV23 vaccine. One-time revaccination 5 years after the first dose of PPSV23 is recommended for people aged 19-64 years who have chronic kidney failure, nephrotic syndrome, asplenia, or immunocompromised conditions. People who received 1-2 doses of PPSV23 before age 65 years should receive another dose of PPSV23 vaccine at age 65 years or later if at least 5 years have passed since the previous dose. Doses of PPSV23 are not needed for people immunized with PPSV23 at or after age 65 years.  Meningococcal vaccine. Adults with asplenia or persistent complement component deficiencies should receive 2 doses of quadrivalent meningococcal conjugate (MenACWY-D) vaccine. The doses should be obtained at least 2 months apart.  Microbiologists working with certain meningococcal bacteria, military recruits, people at risk during an outbreak, and people who travel to or live in countries with a high rate of meningitis should be immunized. A first-year college student up through age   21 years who is living in a residence hall should receive a dose if she did not receive a dose on or after her 16th birthday. Adults who have certain high-risk conditions should receive one or more doses of vaccine.  Hepatitis A vaccine. Adults who wish to be protected from this disease, have certain high-risk conditions, work with hepatitis A-infected animals, work in hepatitis A research labs, or travel to or work in countries with a high rate of hepatitis A should be immunized. Adults who were previously unvaccinated and who anticipate close contact with an international adoptee during the first 60 days after arrival in the [place] from a country with a high rate of hepatitis A should be immunized.  Hepatitis B vaccine. Adults who wish to be protected from this disease, have certain high-risk conditions, may be exposed to blood or other infectious body fluids, are household contacts or sex partners of hepatitis B positive people, are clients or workers in certain care facilities, or travel to or work in countries with a high rate of hepatitis B should be immunized.  Haemophilus influenzae type b (Hib) vaccine. A previously unvaccinated person with asplenia or sickle cell disease or having a scheduled splenectomy should receive 1 dose of Hib vaccine. Regardless of previous immunization, a recipient of a hematopoietic stem cell transplant should receive a 3-dose series 6-12 months after her successful transplant. Hib vaccine is not recommended for adults with HIV infection. Preventive Services / Frequency Ages 64 to 68 years  Blood pressure check.** / Every 1 to 2 years.  Lipid and cholesterol check.** / Every 5 years beginning at age  22.  Clinical breast exam.** / Every 3 years for women in their 88s and 53s.  BRCA-related cancer risk assessment.** / For women who have family members with a BRCA-related cancer (breast, ovarian, tubal, or peritoneal cancers).  Pap test.** / Every 2 years from ages 90 through 51. Every 3 years starting at age 21 through age 56 or 3 with a history of 3 consecutive normal Pap tests.  HPV screening.** / Every 3 years from ages 24 through ages 1 to 46 with a history of 3 consecutive normal Pap tests.  Hepatitis C blood test.** / For any individual with known risks for hepatitis C.  Skin self-exam. / Monthly.  Influenza vaccine. / Every year.  Tetanus, diphtheria, and acellular pertussis (Tdap, Td) vaccine.** / Consult your health care provider. Pregnant women should receive 1 dose of Tdap vaccine during each pregnancy. 1 dose of Td every 10 years.  Varicella vaccine.** / Consult your health care provider. Pregnant females who do not have evidence of immunity should receive the first dose after pregnancy.  HPV vaccine. / 3 doses over 6 months, if 72 and younger. The vaccine is not recommended for use in pregnant females. However, pregnancy testing is not needed before receiving a dose.  Measles, mumps, rubella (MMR) vaccine.** / You need at least 1 dose of MMR if you were born in 1957 or later. You may also need a 2nd dose. For females of childbearing age, rubella immunity should be determined. If there is no evidence of immunity, females who are not pregnant should be vaccinated. If there is no evidence of immunity, females who are pregnant should delay immunization until after pregnancy.  Pneumococcal 13-valent conjugate (PCV13) vaccine.** / Consult your health care provider.  Pneumococcal polysaccharide (PPSV23) vaccine.** / 1 to 2 doses if you smoke cigarettes or if you have certain conditions.  Meningococcal vaccine.** /  1 dose if you are age 19 to 21 years and a first-year college  student living in a residence hall, or have one of several medical conditions, you need to get vaccinated against meningococcal disease. You may also need additional booster doses.  Hepatitis A vaccine.** / Consult your health care provider.  Hepatitis B vaccine.** / Consult your health care provider.  Haemophilus influenzae type b (Hib) vaccine.** / Consult your health care provider. Ages 40 to 64 years  Blood pressure check.** / Every 1 to 2 years.  Lipid and cholesterol check.** / Every 5 years beginning at age 20 years.  Lung cancer screening. / Every year if you are aged 55-80 years and have a 30-pack-year history of smoking and currently smoke or have quit within the past 15 years. Yearly screening is stopped once you have quit smoking for at least 15 years or develop a health problem that would prevent you from having lung cancer treatment.  Clinical breast exam.** / Every year after age 40 years.  BRCA-related cancer risk assessment.** / For women who have family members with a BRCA-related cancer (breast, ovarian, tubal, or peritoneal cancers).  Mammogram.** / Every year beginning at age 40 years and continuing for as long as you are in good health. Consult with your health care provider.  Pap test.** / Every 3 years starting at age 30 years through age 65 or 70 years with a history of 3 consecutive normal Pap tests.  HPV screening.** / Every 3 years from ages 30 years through ages 65 to 70 years with a history of 3 consecutive normal Pap tests.  Fecal occult blood test (FOBT) of stool. / Every year beginning at age 50 years and continuing until age 75 years. You may not need to do this test if you get a colonoscopy every 10 years.  Flexible sigmoidoscopy or colonoscopy.** / Every 5 years for a flexible sigmoidoscopy or every 10 years for a colonoscopy beginning at age 50 years and continuing until age 75 years.  Hepatitis C blood test.** / For all people born from 1945 through  1965 and any individual with known risks for hepatitis C.  Skin self-exam. / Monthly.  Influenza vaccine. / Every year.  Tetanus, diphtheria, and acellular pertussis (Tdap/Td) vaccine.** / Consult your health care provider. Pregnant women should receive 1 dose of Tdap vaccine during each pregnancy. 1 dose of Td every 10 years.  Varicella vaccine.** / Consult your health care provider. Pregnant females who do not have evidence of immunity should receive the first dose after pregnancy.  Zoster vaccine.** / 1 dose for adults aged 60 years or older.  Measles, mumps, rubella (MMR) vaccine.** / You need at least 1 dose of MMR if you were born in 1957 or later. You may also need a 2nd dose. For females of childbearing age, rubella immunity should be determined. If there is no evidence of immunity, females who are not pregnant should be vaccinated. If there is no evidence of immunity, females who are pregnant should delay immunization until after pregnancy.  Pneumococcal 13-valent conjugate (PCV13) vaccine.** / Consult your health care provider.  Pneumococcal polysaccharide (PPSV23) vaccine.** / 1 to 2 doses if you smoke cigarettes or if you have certain conditions.  Meningococcal vaccine.** / Consult your health care provider.  Hepatitis A vaccine.** / Consult your health care provider.  Hepatitis B vaccine.** / Consult your health care provider.  Haemophilus influenzae type b (Hib) vaccine.** / Consult your health care provider. Ages 65   years and over  Blood pressure check.** / Every 1 to 2 years.  Lipid and cholesterol check.** / Every 5 years beginning at age 22 years.  Lung cancer screening. / Every year if you are aged 73-80 years and have a 30-pack-year history of smoking and currently smoke or have quit within the past 15 years. Yearly screening is stopped once you have quit smoking for at least 15 years or develop a health problem that would prevent you from having lung cancer  treatment.  Clinical breast exam.** / Every year after age 4 years.  BRCA-related cancer risk assessment.** / For women who have family members with a BRCA-related cancer (breast, ovarian, tubal, or peritoneal cancers).  Mammogram.** / Every year beginning at age 40 years and continuing for as long as you are in good health. Consult with your health care provider.  Pap test.** / Every 3 years starting at age 9 years through age 34 or 91 years with 3 consecutive normal Pap tests. Testing can be stopped between 65 and 70 years with 3 consecutive normal Pap tests and no abnormal Pap or HPV tests in the past 10 years.  HPV screening.** / Every 3 years from ages 57 years through ages 64 or 45 years with a history of 3 consecutive normal Pap tests. Testing can be stopped between 65 and 70 years with 3 consecutive normal Pap tests and no abnormal Pap or HPV tests in the past 10 years.  Fecal occult blood test (FOBT) of stool. / Every year beginning at age 15 years and continuing until age 17 years. You may not need to do this test if you get a colonoscopy every 10 years.  Flexible sigmoidoscopy or colonoscopy.** / Every 5 years for a flexible sigmoidoscopy or every 10 years for a colonoscopy beginning at age 86 years and continuing until age 71 years.  Hepatitis C blood test.** / For all people born from [birth year] through 1965 and any individual with known risks for hepatitis C.  Osteoporosis screening.** / A one-time screening for women ages 83 years and over and women at risk for fractures or osteoporosis.  Skin self-exam. / Monthly.  Influenza vaccine. / Every year.  Tetanus, diphtheria, and acellular pertussis (Tdap/Td) vaccine.** / 1 dose of Td every 10 years.  Varicella vaccine.** / Consult your health care provider.  Zoster vaccine.** / 1 dose for adults aged 61 years or older.  Pneumococcal 13-valent conjugate (PCV13) vaccine.** / Consult your health care provider.  Pneumococcal  polysaccharide (PPSV23) vaccine.** / 1 dose for all adults aged 28 years and older.  Meningococcal vaccine.** / Consult your health care provider.  Hepatitis A vaccine.** / Consult your health care provider.  Hepatitis B vaccine.** / Consult your health care provider.  Haemophilus influenzae type b (Hib) vaccine.** / Consult your health care provider. ** Family history and personal history of risk and conditions may change your health care provider's recommendations. Document Released: 04/21/2001 Document Revised: 07/10/2013 Document Reviewed: 07/21/2010 [facility] Patient Information 2015 [place], [place]. This information is not intended to replace advice given to you by your health care provider. Make sure you discuss any questions you have with your health care provider.

## 2014-10-12 ENCOUNTER — Encounter: Payer: Self-pay | Admitting: Family Medicine

## 2014-10-12 LAB — CBC WITH DIFFERENTIAL/PLATELET
BASOS ABS: 0 10*3/uL (ref 0.0–0.1)
Basophils Relative: 0.7 % (ref 0.0–3.0)
Eosinophils Absolute: 0.1 10*3/uL (ref 0.0–0.7)
Eosinophils Relative: 1.6 % (ref 0.0–5.0)
HCT: 42.3 % (ref 36.0–46.0)
Hemoglobin: 13.9 g/dL (ref 12.0–15.0)
LYMPHS ABS: 1.5 10*3/uL (ref 0.7–4.0)
Lymphocytes Relative: 27.2 % (ref 12.0–46.0)
MCHC: 32.8 g/dL (ref 30.0–36.0)
MCV: 84.7 fl (ref 78.0–100.0)
MONOS PCT: 3.5 % (ref 3.0–12.0)
Monocytes Absolute: 0.2 10*3/uL (ref 0.1–1.0)
NEUTROS ABS: 3.7 10*3/uL (ref 1.4–7.7)
Neutrophils Relative %: 67 % (ref 43.0–77.0)
PLATELETS: 248 10*3/uL (ref 150.0–400.0)
RBC: 4.99 Mil/uL (ref 3.87–5.11)
RDW: 13.6 % (ref 11.5–15.5)
WBC: 5.5 10*3/uL (ref 4.0–10.5)

## 2014-10-12 LAB — LIPID PANEL
CHOL/HDL RATIO: 3
Cholesterol: 160 mg/dL (ref 0–200)
HDL: 48.1 mg/dL (ref >39.00)
LDL Cholesterol: 101 mg/dL — ABNORMAL HIGH (ref 0–99)
NonHDL: 112.39
TRIGLYCERIDES: 55 mg/dL (ref 0.0–149.0)
VLDL: 11 mg/dL (ref 0.0–40.0)

## 2014-10-12 LAB — BASIC METABOLIC PANEL
BUN: 11 mg/dL (ref 6–23)
CALCIUM: 9.3 mg/dL (ref 8.4–10.5)
CHLORIDE: 103 meq/L (ref 96–112)
CO2: 28 meq/L (ref 19–32)
Creatinine, Ser: 0.65 mg/dL (ref 0.40–1.20)
GFR: 102.93 mL/min (ref >60.00)
GLUCOSE: 83 mg/dL (ref 70–99)
Potassium: 3.9 mEq/L (ref 3.5–5.1)
Sodium: 138 mEq/L (ref 135–145)

## 2014-10-12 LAB — HEPATIC FUNCTION PANEL
ALK PHOS: 48 U/L (ref 39–117)
ALT: 14 U/L (ref 0–35)
AST: 17 U/L (ref 0–37)
Albumin: 4.6 g/dL (ref 3.5–5.2)
BILIRUBIN TOTAL: 0.5 mg/dL (ref 0.2–1.2)
Bilirubin, Direct: 0.1 mg/dL (ref 0.0–0.3)
TOTAL PROTEIN: 7.4 g/dL (ref 6.0–8.3)

## 2014-10-12 LAB — POCT URINALYSIS DIPSTICK
BILIRUBIN UA: NEGATIVE
Glucose, UA: NEGATIVE
Leukocytes, UA: NEGATIVE
Nitrite, UA: NEGATIVE
PH UA: 5.5
Protein, UA: NEGATIVE
Spec Grav, UA: >=1.03
Urobilinogen, UA: 1

## 2014-10-12 LAB — TSH: TSH: 1.15 u[IU]/mL (ref 0.35–4.50)

## 2014-10-13 LAB — URINE CULTURE
COLONY COUNT: NO GROWTH
Organism ID, Bacteria: NO GROWTH

## 2014-10-15 ENCOUNTER — Encounter: Payer: Self-pay | Admitting: *Deleted

## 2014-11-05 ENCOUNTER — Telehealth: Payer: Self-pay | Admitting: Family Medicine

## 2014-11-05 ENCOUNTER — Encounter: Payer: Self-pay | Admitting: Medical

## 2014-11-05 ENCOUNTER — Ambulatory Visit (INDEPENDENT_AMBULATORY_CARE_PROVIDER_SITE_OTHER): Payer: BLUE CROSS/BLUE SHIELD | Admitting: Medical

## 2014-11-05 VITALS — BP 116/78 | HR 88 | Temp 98.8°F | Resp 16 | Ht 62.0 in | Wt 106.2 lb

## 2014-11-05 DIAGNOSIS — K219 Gastro-esophageal reflux disease without esophagitis: Secondary | ICD-10-CM

## 2014-11-05 MED ORDER — RANITIDINE HCL 150 MG PO CAPS: 150.0000 mg | ORAL_CAPSULE | Freq: Two times a day (BID) | ORAL | Status: DC

## 2014-11-05 NOTE — Progress Notes (Signed)
Subjective:    Patient ID: Emily Kirby, female    DOB: 1965/12/30, 49 y.o.   MRN: [id number]  HPI    Pt in states 2 wks ago she had sensation of something stuck in her throat. But she does not have history of choking on any food. Pt can swallow ok. In fact if she does eat or drink she states sensation of something stuck in her throat  feels better.   Pt states she does not belch. No hx of smoking. No hoarse voice. When she eats and drinks she states actually feels good.   Pt does have history of some heart burn. She had hx of this before in the past. ENT saw her in the past(when they did laryngoscopy they told her some reflux type finding). She took ranitidine and next day this stopped. When saw ent was 7-8 years ago.  This time she tried ranitidine. And she tried omeprazole.   Then she tried some allergy otc meds claritin. Northing has helped.    Review of Systems  Constitutional: Negative for fever, chills and fatigue.  HENT: Negative for congestion, dental problem, ear pain, nosebleeds, postnasal drip, rhinorrhea, sinus pressure, sore throat, tinnitus and voice change.        See hpi. Hx of feeling as something in her throat when had reflux type symptoms.  Respiratory: Negative for cough, chest tightness, shortness of breath and wheezing.   Cardiovascular: Negative for chest pain and palpitations.  Gastrointestinal: Positive for abdominal pain. Negative for diarrhea, constipation and blood in stool.       Mild symptoms on exam  Musculoskeletal: Negative for back pain.  Neurological: Negative for dizziness and headaches.  Hematological: Negative for adenopathy. Does not bruise/bleed easily.  Psychiatric/Behavioral: Negative for behavioral problems and confusion.    Past Medical History  Diagnosis Date  . Chicken pox   . Kidney stones   . AC (acromioclavicular) joint bone spurs     Social History   Social History  . Marital Status: Married    Spouse Name: N/A  .  Number of Children: N/A  . Years of Education: N/A   Occupational History  .      court interpreter   Social History Main Topics  . Smoking status: Never Smoker   . Smokeless tobacco: Not on file  . Alcohol Use: Yes  . Drug Use: No  . Sexual Activity:    Partners: Male   Other Topics Concern  . Not on file   Social History Narrative    Past Surgical History  Procedure Laterality Date  . Liposuction    . Cesarean section      Family History  Problem Relation Age of Onset  . Cancer Father     prostate  . Hearing loss Father   . COPD Father   . Heart disease Father     MI  . Hypertension Mother     Allergies  Allergen Reactions  . Codeine Other (See Comments)    Stomach upset  . Pseudoephedrine Other (See Comments)    Tingling all over body    No current outpatient prescriptions on file prior to visit.   No current facility-administered medications on file prior to visit.    BP 116/78 mmHg  Pulse 88  Temp(Src) 98.8 F (37.1 C) (Oral)  Resp 16  Ht 5\' 2"  (1.575 m)  Wt 106 lb 3.2 oz (48.172 kg)  BMI 19.42 kg/m2  SpO2 98%  LMP 10/11/2014  Objective:   Physical Exam  General Appearance- Not in acute distress.  HEENT Eyes- Scleraeral/Conjuntiva-bilat- Not Yellow. Mouth & Throat- Normal.  Chest and Lung Exam Auscultation: Breath sounds:-Normal. CTA. Adventitious sounds:- No Adventitious sounds.  Cardiovascular Auscultation:Rythm - Regular. Heart Sounds -Normal heart sounds.  Abdomen Inspection:-Inspection Normal.  Palpation/Perucssion: Palpation and Percussion of the abdomen reveal- faint umbilical/epigastric  Tender, No Rebound tenderness, No rigidity(Guarding) and No Palpable abdominal masses.  Liver:-Normal.  Spleen:- Normal.   Back- no cva tenderness .      Assessment & Plan:  By your history I do think reflux is likely cause of your symptoms. Diet as explained. nexium sample otc dose for 10 days. Can also start ranitidine  rx. This combination may be needed.  Follow up in 2 wks or as needed.  Recent labs neg. But if symptoms persist would get amylase,lipase and h plori breath test. Also consider referal to GI for egd.

## 2014-11-05 NOTE — Telephone Encounter (Signed)
Pt needs appointment today

## 2014-11-05 NOTE — Patient Instructions (Signed)
By your history I do think reflux is likely cause of your symptoms. Diet as explained. nexium sample otc dose for 10 days. Can also start ranitidine rx. This combination may be needed.  Follow up in 2 wks or as needed.  Recent labs neg. But if symptoms persist would get amylase,lipase and h plori breath test. Also consider referal to GI for egd.

## 2014-11-05 NOTE — Telephone Encounter (Signed)
Routed to provider for FYI

## 2014-11-05 NOTE — Progress Notes (Signed)
Pre visit review using our clinic review tool, if applicable. No additional management support is needed unless otherwise documented below in the visit note. 

## 2014-11-05 NOTE — Telephone Encounter (Signed)
[facility] - Client TELEPHONE ADVICE RECORD [facility]  Patient Name: Emily Kirby  DOB: 01/17/1966    Initial Comment Caller states feels like has a lump in throat; feeling tired;   Nurse Assessment  Nurse: Wynetta Emery, RN, Baker Janus Date/Time (Eastern Time): 11/05/2014 10:35:54 AM  Confirm and document reason for call. If symptomatic, describe symptoms. ---[name] has a feeling she has something stuck in her throat. is able to eat and drink. -- also c/o tired. has had been diagnosed in past with reflux and placed on medication and taking it and no relief noted onset two weeks  Has the patient traveled out of the country within the last 30 days? ---No  Does the patient require triage? ---Yes  Related visit to physician within the last 2 weeks? ---No  Does the PT have any chronic conditions? (i.e. diabetes, asthma, etc.) ---Yes  List chronic conditions. ---GERDS  Did the patient indicate they were pregnant? ---No     Guidelines    Guideline Title Affirmed Question Affirmed Notes  Uvula Swelling [1] Swollen uvula present > 4 hours AND [2] now much improved    Final Disposition User   See Physician within 4 Hours (or PCP triage) Wynetta Emery, RN, Baker Janus    Comments  NOTE: HAS APPT SCHEDULED AT [place]; TRIAGED TO SEE MD IN 4 HOURS -- STATES HAS WAITED TWO WEEKS CAN WAIT TO BE SEEN IN AM NOTE; C/O IS HAS A FEELING SOMETHING IS STUCK IN BACK OF THROAT, ---HAS NOT CHECKED TO SEE IF UVULA CAN BE SWOLLEN BUT FEELS IT IS FURTHER DOWN   Disagree/Comply: Comply

## 2014-11-05 NOTE — Telephone Encounter (Signed)
Scheduled with Mackie Pai, PA at 4:00.

## 2014-11-06 ENCOUNTER — Ambulatory Visit: Payer: BLUE CROSS/BLUE SHIELD | Admitting: Family Medicine

## 2014-11-20 ENCOUNTER — Ambulatory Visit (INDEPENDENT_AMBULATORY_CARE_PROVIDER_SITE_OTHER): Payer: BLUE CROSS/BLUE SHIELD | Admitting: Family Medicine

## 2014-11-20 ENCOUNTER — Encounter: Payer: Self-pay | Admitting: Family Medicine

## 2014-11-20 VITALS — BP 121/79 | HR 76 | Temp 98.0°F | Wt 105.0 lb

## 2014-11-20 DIAGNOSIS — K219 Gastro-esophageal reflux disease without esophagitis: Secondary | ICD-10-CM | POA: Diagnosis not present

## 2014-11-20 DIAGNOSIS — R1013 Epigastric pain: Secondary | ICD-10-CM

## 2014-11-20 MED ORDER — OMEPRAZOLE 40 MG PO CPDR: 40.0000 mg | DELAYED_RELEASE_CAPSULE | Freq: Every day | ORAL | Status: DC

## 2014-11-20 NOTE — Patient Instructions (Signed)

## 2014-11-20 NOTE — Progress Notes (Signed)
Patient ID: Emily Kirby, female    DOB: [birth year]  Age: 49 y.o. MRN: [id number]    Subjective:  Subjective HPI [name] presents for f/u from last visit.  Pt still c/o lump in throat and midepigastric pain.  She stopped the nexium and is still taking the zantac.  She was dx with gerd in past by ENT.    Review of Systems  Constitutional: Negative for diaphoresis, appetite change, fatigue and unexpected weight change.  Eyes: Negative for pain, redness and visual disturbance.  Respiratory: Negative for cough, chest tightness, shortness of breath and wheezing.   Cardiovascular: Negative for chest pain, palpitations and leg swelling.  Gastrointestinal: Positive for abdominal pain. Negative for nausea, diarrhea, constipation, blood in stool and abdominal distention.  Endocrine: Negative for cold intolerance, heat intolerance, polydipsia, polyphagia and polyuria.  Genitourinary: Negative for dysuria, frequency and difficulty urinating.  Neurological: Negative for dizziness, light-headedness, numbness and headaches.    History Past Medical History  Diagnosis Date  . Chicken pox   . Kidney stones   . AC (acromioclavicular) joint bone spurs     She has past surgical history that includes Liposuction and Cesarean section.   Her family history includes COPD in her father; Cancer in her father; Hearing loss in her father; Heart disease in her father; Hypertension in her mother.She reports that she has never smoked. She does not have any smokeless tobacco history on file. She reports that she drinks alcohol. She reports that she does not use illicit drugs.  Current Outpatient Prescriptions on File Prior to Visit  Medication Sig Dispense Refill  . ranitidine (ZANTAC) 150 MG capsule Take 1 capsule (150 mg total) by mouth 2 (two) times daily. 60 capsule 0   No current facility-administered medications on file prior to visit.     Objective:  Objective Physical Exam  Constitutional: She is  oriented to person, place, and time. She appears well-developed and well-nourished.  HENT:  Head: Normocephalic and atraumatic.  Eyes: Conjunctivae and EOM are normal.  Neck: Normal range of motion. Neck supple. No JVD present. Carotid bruit is not present. No thyromegaly present.  Cardiovascular: Normal rate, regular rhythm and normal heart sounds.   No murmur heard. Pulmonary/Chest: Effort normal and breath sounds normal. No respiratory distress. She has no wheezes. She has no rales. She exhibits no tenderness.  Abdominal: Soft. There is tenderness. There is no rebound and no guarding.    Musculoskeletal: She exhibits no edema.  Neurological: She is alert and oriented to person, place, and time.  Psychiatric: She has a normal mood and affect.   BP 121/79 mmHg  Pulse 76  Temp(Src) 98 F (36.7 C)  Wt 105 lb (47.628 kg)  SpO2 99%  LMP 10/11/2014 Wt Readings from Last 3 Encounters:  11/20/14 105 lb (47.628 kg)  11/05/14 106 lb 3.2 oz (48.172 kg)  10/11/14 105 lb 3.2 oz (47.718 kg)     Lab Results  Component Value Date   WBC 5.5 10/11/2014   HGB 13.9 10/11/2014   HCT 42.3 10/11/2014   PLT 248.0 10/11/2014   GLUCOSE 83 10/11/2014   CHOL 160 10/11/2014   TRIG 55.0 10/11/2014   HDL 48.10 10/11/2014   LDLCALC 101* 10/11/2014   ALT 14 10/11/2014   AST 17 10/11/2014   NA 138 10/11/2014   K 3.9 10/11/2014   CL 103 10/11/2014   CREATININE 0.65 10/11/2014   BUN 11 10/11/2014   CO2 28 10/11/2014   TSH 1.15 10/11/2014  Dg Hysterogram  05/20/2009   Clinical Data: Secondary infertility.   HYSTEROSALPINGOGRAM   Technique:  Hysterosalpingogram was performed by the ordering physician under fluoroscopy.  Fluoroscopic images are submitted for interpretation following the procedure.   Fluoroscopy Time:  0.9 minutes.   Findings: The endometrial cavity of the uterus is normal in contour and appearance.   Contrast filling of both fallopian tubes is seen, and both tubes are normal in  appearance.  Intraperitoneal spill of the contrast from both fallopian tubes is demonstrated.   IMPRESSION: Normal study.  Fallopian tubes are patent bilaterally.  Provider: Jerel Shepherd    Assessment & Plan:  Plan I am having Ms. Latendresse start on omeprazole. I am also having her maintain her ranitidine.  Meds ordered this encounter  Medications  . omeprazole (PRILOSEC) 40 MG capsule    Sig: Take 1 capsule (40 mg total) by mouth daily.    Dispense:  30 capsule    Refill:  3    Problem List Items Addressed This Visit    None    Visit Diagnoses    Gastroesophageal reflux disease, esophagitis presence not specified    -  Primary    Relevant Medications    omeprazole (PRILOSEC) 40 MG capsule    Other Relevant Orders    H. pylori breath test    Comp Met (CMET)    CBC with Differential/Platelet    Amylase    Lipase    Ambulatory referral to Gastroenterology       Follow-up: No Follow-up on file.  Garnet Koyanagi, DO

## 2014-11-20 NOTE — Progress Notes (Signed)
Pre visit review using our clinic review tool, if applicable. No additional management support is needed unless otherwise documented below in the visit note. 

## 2014-11-20 NOTE — Assessment & Plan Note (Signed)
con't zantac Pt will com back tomorrow for labs and will refer to GI for possible egd Pt knows to not start omeprazole until after the breath test

## 2014-11-21 ENCOUNTER — Telehealth: Payer: Self-pay | Admitting: Family Medicine

## 2014-11-21 ENCOUNTER — Other Ambulatory Visit (INDEPENDENT_AMBULATORY_CARE_PROVIDER_SITE_OTHER): Payer: BLUE CROSS/BLUE SHIELD

## 2014-11-21 ENCOUNTER — Encounter: Payer: Self-pay | Admitting: Gastroenterology

## 2014-11-21 DIAGNOSIS — K219 Gastro-esophageal reflux disease without esophagitis: Secondary | ICD-10-CM | POA: Diagnosis not present

## 2014-11-21 LAB — COMPREHENSIVE METABOLIC PANEL
ALK PHOS: 55 U/L (ref 39–117)
ALT: 24 U/L (ref 0–35)
AST: 18 U/L (ref 0–37)
Albumin: 4.3 g/dL (ref 3.5–5.2)
BUN: 16 mg/dL (ref 6–23)
CHLORIDE: 105 meq/L (ref 96–112)
CO2: 28 mEq/L (ref 19–32)
Calcium: 9.5 mg/dL (ref 8.4–10.5)
Creatinine, Ser: 0.74 mg/dL (ref 0.40–1.20)
GFR: 88.59 mL/min (ref >60.00)
GLUCOSE: 66 mg/dL — AB (ref 70–99)
POTASSIUM: 3.9 meq/L (ref 3.5–5.1)
SODIUM: 140 meq/L (ref 135–145)
TOTAL PROTEIN: 7 g/dL (ref 6.0–8.3)
Total Bilirubin: 0.4 mg/dL (ref 0.2–1.2)

## 2014-11-21 LAB — AMYLASE: AMYLASE: 48 U/L (ref 27–131)

## 2014-11-21 LAB — CBC WITH DIFFERENTIAL/PLATELET
BASOS PCT: 0.6 % (ref 0.0–3.0)
Basophils Absolute: 0 10*3/uL (ref 0.0–0.1)
EOS PCT: 2.6 % (ref 0.0–5.0)
Eosinophils Absolute: 0.1 10*3/uL (ref 0.0–0.7)
HCT: 41.4 % (ref 36.0–46.0)
Hemoglobin: 13.6 g/dL (ref 12.0–15.0)
LYMPHS ABS: 1.4 10*3/uL (ref 0.7–4.0)
Lymphocytes Relative: 26.1 % (ref 12.0–46.0)
MCHC: 33 g/dL (ref 30.0–36.0)
MCV: 84.3 fl (ref 78.0–100.0)
MONO ABS: 0.3 10*3/uL (ref 0.1–1.0)
Monocytes Relative: 5.9 % (ref 3.0–12.0)
NEUTROS PCT: 64.8 % (ref 43.0–77.0)
Neutro Abs: 3.5 10*3/uL (ref 1.4–7.7)
PLATELETS: 243 10*3/uL (ref 150.0–400.0)
RBC: 4.91 Mil/uL (ref 3.87–5.11)
RDW: 13.5 % (ref 11.5–15.5)
WBC: 5.4 10*3/uL (ref 4.0–10.5)

## 2014-11-21 LAB — LIPASE: Lipase: 30 U/L (ref 11.0–59.0)

## 2014-11-21 NOTE — Telephone Encounter (Signed)
Pt calling about meds. She did not pick up omeprazole because it wasn't covered by ins. She was advised to get OTC but she didn't know how much to get or how long to take. Pt asking if she is to continue taking ranitidine as well. Please call.  Best# [phone]

## 2014-11-22 LAB — H. PYLORI BREATH TEST: H. pylori Breath Test: NOT DETECTED

## 2014-11-22 NOTE — Telephone Encounter (Signed)
Pt returning call. Best # [phone]

## 2014-11-22 NOTE — Telephone Encounter (Signed)
Yes-- until she sees GI

## 2014-11-22 NOTE — Telephone Encounter (Signed)
It does not cause kidney stones--- she can just take the zantac then 150 mg bid

## 2014-11-22 NOTE — Telephone Encounter (Signed)
Patients concern is that she has a HX of Kidney stones and feels this medication will cause them. Please advise. Her appointment is not until November.

## 2014-11-22 NOTE — Telephone Encounter (Signed)
Please advise      KP 

## 2014-11-22 NOTE — Telephone Encounter (Signed)
Called patient Left message for call back

## 2014-11-23 NOTE — Telephone Encounter (Signed)
Called patient with advice to take Zantac 150 mg bid. Patient agreed.

## 2014-11-26 ENCOUNTER — Telehealth: Payer: Self-pay | Admitting: Family Medicine

## 2014-11-26 NOTE — Telephone Encounter (Signed)
Pt called about referral for GI. She said appt scheduled for 11/3 with [facility]. She is asking if there is any way we can have her seen sooner. She said medication and acids are just leaving her stomach upset and hurting. Please notify pt if we can get in sooner at [facility] or to another provider to get in sooner. [phone] - cannot carry phone in work please leave detailed msg

## 2014-11-26 NOTE — Telephone Encounter (Addendum)
She needs to call GI unless Dr.Lowne is willing to call.     KP

## 2014-11-26 NOTE — Telephone Encounter (Signed)
jen or marj----- any chance she can be seen sooner

## 2014-11-27 NOTE — Telephone Encounter (Signed)
Left message for call back.

## 2014-11-27 NOTE — Telephone Encounter (Signed)
Could this patient be seen sooner with GI?

## 2014-12-03 ENCOUNTER — Telehealth: Payer: Self-pay | Admitting: Family Medicine

## 2014-12-03 ENCOUNTER — Other Ambulatory Visit: Payer: Self-pay | Admitting: Family Medicine

## 2014-12-03 DIAGNOSIS — K2100 Gastro-esophageal reflux disease with esophagitis, without bleeding: Secondary | ICD-10-CM

## 2014-12-03 MED ORDER — PANTOPRAZOLE SODIUM 40 MG PO TBEC: 40.0000 mg | DELAYED_RELEASE_TABLET | Freq: Every day | ORAL | Status: DC

## 2014-12-03 NOTE — Telephone Encounter (Signed)
Patient states she is experiencing the same symptoms. Is not getting any relief.  Could she try something else?  Please advise.

## 2014-12-03 NOTE — Telephone Encounter (Signed)
Add protonix 40 mg #30  1 po qd I don't remember discussing calcium deposits --- but really thinks it s all coming from stomach , gerd etc

## 2014-12-03 NOTE — Telephone Encounter (Signed)
Spoke with patient who states that she cannot afford the protonix ($150). Patient is concerned that the calcium in the omeprazole will lead to kidney stones.  Spoke with PCP who conquers that it will be fine until her GI appt. Take two OTC omeprazole (40mg ) until she sees GI LM on VM (per patient) with the above details.

## 2014-12-03 NOTE — Telephone Encounter (Signed)
Relation to pt: self  Call back number:[phone]   Reason for call:  patient would like to discuss ranitidine (ZANTAC) 150 MG capsule patient states she does not see any improvement and states she has a gastro appointment in October and would like to know if she should continue taking medication. Please advise

## 2014-12-03 NOTE — Telephone Encounter (Signed)
Caller name:Jean Relationship to patient:self Can be reached:[phone] Pharmacy: [facility]   Reason for call:She has been tqaking ranitaden all these weeks and she sees no improvement.  She has an appointment with gi next Thursday 10-6 .  She has a lump in her throat which Dr Etter Sjogren said was a calcium depost.  She wonders if her problem is a calcium deposit  You can leave detailed message

## 2014-12-13 ENCOUNTER — Ambulatory Visit (INDEPENDENT_AMBULATORY_CARE_PROVIDER_SITE_OTHER): Payer: BLUE CROSS/BLUE SHIELD | Admitting: Physician Assistant

## 2014-12-13 ENCOUNTER — Encounter: Payer: Self-pay | Admitting: Physician Assistant

## 2014-12-13 VITALS — BP 108/68 | HR 56 | Ht 62.0 in | Wt 103.0 lb

## 2014-12-13 DIAGNOSIS — F458 Other somatoform disorders: Secondary | ICD-10-CM | POA: Diagnosis not present

## 2014-12-13 DIAGNOSIS — K219 Gastro-esophageal reflux disease without esophagitis: Secondary | ICD-10-CM

## 2014-12-13 DIAGNOSIS — R0989 Other specified symptoms and signs involving the circulatory and respiratory systems: Secondary | ICD-10-CM

## 2014-12-13 MED ORDER — OMEPRAZOLE 40 MG PO CPDR: 40.0000 mg | DELAYED_RELEASE_CAPSULE | Freq: Every day | ORAL | Status: DC

## 2014-12-13 NOTE — Progress Notes (Signed)
Patient ID: Emily Kirby, female   DOB: Jul 31, 1965, 49 y.o.   MRN: [id number]   Subjective:    Patient ID: Emily Kirby, female    DOB: Jul 21, 1965, 49 y.o.   MRN: [id number]  HPI  [name]  is a pleasant 49 year old Hispanic female referred today by Dr. Etter Sjogren for EGD. Patient relates having a "fullness and "knot sensation" in her throat over the past couple of months. She says this is been constant and not progressive. She has no complaints of dysphagia or odynophagia. No regular heartburn or indigestion, appetite has been fine and weight is stable. She denies abdominal pain. She was asked to start taking OTC Zantac which she did twice daily for a month without improvement in her symptoms and then was given a trial of omeprazole 20 mg by mouth every morning. She has not started taking this as yet. Exline She Route relates having a similar type of episode about 8 years ago and at that time had been given a trial of Zantac which did clear her symptoms. She was seen by ENT elsewhere and was told that she had inflammation and erythema of her larynx. This was most likely related to reflux. Patient says she has had some problems with abdominal bloating and discomfort as well but has eliminated a lot of carbs from her diet and says that has helped quite a bit. Sh e is also following antireflux regimen fairly strictly ,which has not altered her complaint of fullness in her throat.  Review of Systems Pertinent positive and negative review of systems were noted in the above HPI section.  All other review of systems was otherwise negative.  Outpatient Encounter Prescriptions as of 12/13/2014  Medication Sig  . omeprazole (PRILOSEC) 40 MG capsule Take 1 capsule (40 mg total) by mouth daily.  . ranitidine (ZANTAC) 150 MG capsule Take 1 capsule (150 mg total) by mouth 2 (two) times daily.  . [DISCONTINUED] omeprazole (PRILOSEC) 40 MG capsule Take 1 capsule (40 mg total) by mouth daily. (Patient taking differently: Take 20  mg by mouth daily. )  . [DISCONTINUED] pantoprazole (PROTONIX) 40 MG tablet Take 1 tablet (40 mg total) by mouth daily.   No facility-administered encounter medications on file as of 12/13/2014.   Allergies  Allergen Reactions  . Codeine Other (See Comments)    Stomach upset  . Pseudoephedrine Other (See Comments)    Tingling all over body   Patient Active Problem List   Diagnosis Date Noted  . Midepigastric pain 11/20/2014   Social History   Social History  . Marital Status: Married    Spouse Name: N/A  . Number of Children: N/A  . Years of Education: N/A   Occupational History  .      court interpreter   Social History Main Topics  . Smoking status: Never Smoker   . Smokeless tobacco: Not on file  . Alcohol Use: Yes  . Drug Use: No  . Sexual Activity:    Partners: Male   Other Topics Concern  . Not on file   Social History Narrative    Ms. Vaux's family history includes COPD in her father; Cancer in her father; Hearing loss in her father; Heart disease in her father; Hypertension in her mother.      Objective:    Filed Vitals:   12/13/14 0928  BP: 108/68  Pulse: 56    Physical Exam  well-developed Hispanic female in no acute distress, pleasant blood pressure 108/68 pulse 56 height 5  foot 2 weight 103. HEENT; nontraumatic normocephalic EOMI PERRLA sclera anicteric, Cardiovascular; regular rate and rhythm with S1-S2 no murmur or gallop, Pulmonary; clear bilaterally, Abdomen ;soft nontender nondistended bowel sounds are active is no palpable mass or hepatosplenomegaly bowel sounds are present on Rectal exam not done, Ext; no  clubbing cyanosis or edema skin warm and dry, Neuropsych ;mood and affect appropriate       Assessment & Plan:   #1 49 yo female with Globus type sxs/fullness in throat   X 2 months- no improvement with trial of Zantac Similar sxs 8 years ago with ENT eval and told GERD related #2 abdominal bloating - much improved on low carb  diet  Plan; Strict antireflux regimen  Start trial of omeprazole  20 mg po BID ac breakfast and ac dinner. Pt advised may take 2 months for significant improvement  Schedule for EGD with Dr. Henrene Pastor - procedure discussed in detail  with pt and she is agreeable to proceed.   Amy S Esterwood PA-C 12/13/2014   Cc: Rosalita Chessman, DO

## 2014-12-13 NOTE — Progress Notes (Signed)
Agree with initial assessment and plans as outlined 

## 2014-12-13 NOTE — Patient Instructions (Signed)
You have been scheduled for an endoscopy. Please follow written instructions given to you at your visit today. If you use inhalers (even only as needed), please bring them with you on the day of your procedure. Your physician has requested that you go to www.startemmi.com and enter the access code given to you at your visit today. This web site gives a general overview about your procedure. However, you should still follow specific instructions given to you by our office regarding your preparation for the procedure.  Take Omeprazole 40 mg once daily. Continue Antireflux regimine,

## 2014-12-14 ENCOUNTER — Encounter: Payer: Self-pay | Admitting: Internal Medicine

## 2014-12-14 ENCOUNTER — Ambulatory Visit (AMBULATORY_SURGERY_CENTER): Payer: BLUE CROSS/BLUE SHIELD | Admitting: Internal Medicine

## 2014-12-14 VITALS — BP 121/82 | HR 67 | Temp 95.8°F | Resp 20 | Ht 62.0 in | Wt 103.0 lb

## 2014-12-14 DIAGNOSIS — F458 Other somatoform disorders: Secondary | ICD-10-CM | POA: Diagnosis not present

## 2014-12-14 DIAGNOSIS — K219 Gastro-esophageal reflux disease without esophagitis: Secondary | ICD-10-CM | POA: Diagnosis present

## 2014-12-14 DIAGNOSIS — R0989 Other specified symptoms and signs involving the circulatory and respiratory systems: Secondary | ICD-10-CM

## 2014-12-14 MED ORDER — SODIUM CHLORIDE 0.9 % IV SOLN: 500.0000 mL | INTRAVENOUS | Status: DC

## 2014-12-14 NOTE — Progress Notes (Signed)
Report to PACU, RN, vss, BBS= Clear.  

## 2014-12-14 NOTE — Op Note (Signed)
[place]  [address] [place], [id number]   ENDOSCOPY PROCEDURE REPORT  PATIENT: Emily Kirby, Emily Kirby  MR#: [id number] BIRTHDATE: 17-Jan-1966 , 22  yrs. old GENDER: female ENDOSCOPIST: Eustace Quail, MD REFERRED BY:  Garnet Koyanagi, DO PROCEDURE DATE:  12/14/2014 PROCEDURE:  EGD, diagnostic ASA CLASS:     Class I INDICATIONS:  globus sensation. MEDICATIONS: Monitored anesthesia care and Propofol 130 mg IV TOPICAL ANESTHETIC: none  DESCRIPTION OF PROCEDURE: After the risks benefits and alternatives of the procedure were thoroughly explained, informed consent was obtained.  The LB [id number] endoscope was introduced through the mouth and advanced to the second portion of the duodenum , Without limitations.  The instrument was slowly withdrawn as the mucosa was fully examined.     EXAM: The posterior pharynx was unremarkable.The esophagus and gastroesophageal junction were completely normal in appearance. The stomach was entered and closely examined.The antrum, angularis, and lesser curvature were well visualized, including a retroflexed view of the cardia and fundus.  The stomach wall was normally distensable.  The scope passed easily through the pylorus into the duodenum.  Retroflexed views revealed no abnormalities.     The scope was then withdrawn from the patient and the procedure completed.  COMPLICATIONS: There were no immediate complications.  ENDOSCOPIC IMPRESSION: 1. Normal EGD and posterior pharynx  RECOMMENDATIONS: 1. Recommend omeprazole daily for 1 month 2. Return to the care of Dr. Etter Sjogren 3. Recommend screening colonoscopy for colon cancer prevention beginning at age 26  REPEAT EXAM:  eSigned:  Eustace Quail, MD 12/14/2014 3:03 PM    CC:The Patient and Rosalita Chessman, DO

## 2014-12-14 NOTE — Patient Instructions (Signed)
YOU HAD AN ENDOSCOPIC PROCEDURE TODAY AT [facility]:   Refer to the procedure report that was given to you for any specific questions about what was found during the examination.  If the procedure report does not answer your questions, please call your gastroenterologist to clarify.  If you requested that your care partner not be given the details of your procedure findings, then the procedure report has been included in a sealed envelope for you to review at your convenience later.  YOU SHOULD EXPECT: Some feelings of bloating in the abdomen. Passage of more gas than usual.  Walking can help get rid of the air that was put into your GI tract during the procedure and reduce the bloating. If you had a lower endoscopy (such as a colonoscopy or flexible sigmoidoscopy) you may notice spotting of blood in your stool or on the toilet paper. If you underwent a bowel prep for your procedure, you may not have a normal bowel movement for a few days.  Please Note:  You might notice some irritation and congestion in your nose or some drainage.  This is from the oxygen used during your procedure.  There is no need for concern and it should clear up in a day or so.  SYMPTOMS TO REPORT IMMEDIATELY:   Following upper endoscopy (EGD)  Vomiting of blood or coffee ground material  New chest pain or pain under the shoulder blades  Painful or persistently difficult swallowing  New shortness of breath  Fever of 100F or higher  Black, tarry-looking stools  For urgent or emergent issues, a gastroenterologist can be reached at any hour by calling [phone].   DIET: Your first meal following the procedure should be a small meal and then it is ok to progress to your normal diet. Heavy or fried foods are harder to digest and may make you feel nauseous or bloated.  Likewise, meals heavy in dairy and vegetables can increase bloating.  Drink plenty of fluids but you should avoid alcoholic beverages for  24 hours.  ACTIVITY:  You should plan to take it easy for the rest of today and you should NOT DRIVE or use heavy machinery until tomorrow (because of the sedation medicines used during the test).    FOLLOW UP: Our staff will call the number listed on your records the next business day following your procedure to check on you and address any questions or concerns that you may have regarding the information given to you following your procedure. If we do not reach you, we will leave a message.  However, if you are feeling well and you are not experiencing any problems, there is no need to return our call.  We will assume that you have returned to your regular daily activities without incident.  If any biopsies were taken you will be contacted by phone or by letter within the next 1-3 weeks.  Please call us at [phone] if you have not heard about the biopsies in 3 weeks.    SIGNATURES/CONFIDENTIALITY: You and/or your care partner have signed paperwork which will be entered into your electronic medical record.  These signatures attest to the fact that that the information above on your After Visit Summary has been reviewed and is understood.  Full responsibility of the confidentiality of this discharge information lies with you and/or your care-partner.  Omeprazole daily for 1 month.

## 2014-12-17 ENCOUNTER — Encounter: Payer: Self-pay | Admitting: Family Medicine

## 2014-12-17 ENCOUNTER — Ambulatory Visit (INDEPENDENT_AMBULATORY_CARE_PROVIDER_SITE_OTHER): Payer: BLUE CROSS/BLUE SHIELD | Admitting: Family Medicine

## 2014-12-17 ENCOUNTER — Telehealth: Payer: Self-pay | Admitting: *Deleted

## 2014-12-17 VITALS — BP 98/62 | HR 75 | Temp 98.6°F | Wt 105.4 lb

## 2014-12-17 DIAGNOSIS — F411 Generalized anxiety disorder: Secondary | ICD-10-CM

## 2014-12-17 MED ORDER — ESCITALOPRAM OXALATE 10 MG PO TABS: ORAL_TABLET | ORAL | Status: DC

## 2014-12-17 NOTE — Patient Instructions (Signed)
Generalized Anxiety Disorder Generalized anxiety disorder (GAD) is a mental disorder. It interferes with life functions, including relationships, work, and school. GAD is different from normal anxiety, which everyone experiences at some point in their lives in response to specific life events and activities. Normal anxiety actually helps us prepare for and get through these life events and activities. Normal anxiety goes away after the event or activity is over.  GAD causes anxiety that is not necessarily related to specific events or activities. It also causes excess anxiety in proportion to specific events or activities. The anxiety associated with GAD is also difficult to control. GAD can vary from mild to severe. People with severe GAD can have intense waves of anxiety with physical symptoms (panic attacks).  SYMPTOMS The anxiety and worry associated with GAD are difficult to control. This anxiety and worry are related to many life events and activities and also occur more days than not for 6 months or longer. People with GAD also have three or more of the following symptoms (one or more in children):  Restlessness.   Fatigue.  Difficulty concentrating.   Irritability.  Muscle tension.  Difficulty sleeping or unsatisfying sleep. DIAGNOSIS GAD is diagnosed through an assessment by your health care provider. Your health care provider will ask you questions aboutyour mood,physical symptoms, and events in your life. Your health care provider may ask you about your medical history and use of alcohol or drugs, including prescription medicines. Your health care provider may also do a physical exam and blood tests. Certain medical conditions and the use of certain substances can cause symptoms similar to those associated with GAD. Your health care provider may refer you to a mental health specialist for further evaluation. TREATMENT The following therapies are usually used to treat GAD:    Medication. Antidepressant medication usually is prescribed for long-term daily control. Antianxiety medicines may be added in severe cases, especially when panic attacks occur.   Talk therapy (psychotherapy). Certain types of talk therapy can be helpful in treating GAD by providing support, education, and guidance. A form of talk therapy called cognitive behavioral therapy can teach you healthy ways to think about and react to daily life events and activities.  Stress managementtechniques. These include yoga, meditation, and exercise and can be very helpful when they are practiced regularly. A mental health specialist can help determine which treatment is best for you. Some people see improvement with one therapy. However, other people require a combination of therapies.   This information is not intended to replace advice given to you by your health care provider. Make sure you discuss any questions you have with your health care provider.   Document Released: 06/20/2012 Document Revised: 03/16/2014 Document Reviewed: 06/20/2012 Elsevier Interactive Patient Education 2016 [employer] Inc.  

## 2014-12-17 NOTE — Progress Notes (Signed)
Pre visit review using our clinic review tool, if applicable. No additional management support is needed unless otherwise documented below in the visit note. 

## 2014-12-17 NOTE — Progress Notes (Signed)
Patient ID: Emily Kirby, female    DOB: 04-28-1965  Age: 49 y.o. MRN: [id number]    Subjective:  Subjective HPI Emily Kirby presents for ANXIETY.  She saw GI and egd was normal.  Dr Henrene Pastor discussed the possiblility of it being stress.   Pt admits to being very high strung.  She grinds her teeth and wears a mouth guard and is constantly playing with her hair.  Thinking back , she says,  She probably has always been anxious.    Review of Systems  Constitutional: Negative for diaphoresis, appetite change, fatigue and unexpected weight change.  Eyes: Negative for pain, redness and visual disturbance.  Respiratory: Negative for cough, chest tightness, shortness of breath and wheezing.   Cardiovascular: Negative for chest pain, palpitations and leg swelling.  Endocrine: Negative for cold intolerance, heat intolerance, polydipsia, polyphagia and polyuria.  Genitourinary: Negative for dysuria, frequency and difficulty urinating.  Neurological: Negative for dizziness, light-headedness, numbness and headaches.  Psychiatric/Behavioral: Positive for sleep disturbance. Negative for suicidal ideas, self-injury, dysphoric mood and decreased concentration. The patient is nervous/anxious.     History Past Medical History  Diagnosis Date  . Chicken pox   . Kidney stones   . AC (acromioclavicular) joint bone spurs   . Allergy     SEASONAL    She has past surgical history that includes Liposuction and Cesarean section.   Her family history includes COPD in her father; Cancer in her father; Hearing loss in her father; Heart disease in her father; Hypertension in her mother; Prostate cancer in her father.She reports that she has never smoked. She has never used smokeless tobacco. She reports that she drinks alcohol. She reports that she does not use illicit drugs.  Current Outpatient Prescriptions on File Prior to Visit  Medication Sig Dispense Refill  . omeprazole (PRILOSEC) 40 MG capsule Take 1  capsule (40 mg total) by mouth daily. 60 capsule 6   No current facility-administered medications on file prior to visit.     Objective:  Objective Physical Exam  Constitutional: She appears well-developed and well-nourished. No distress.  Psychiatric: Her speech is normal. Thought content normal. Her mood appears anxious. Her affect is not angry, not blunt, not labile and not inappropriate. Cognition and memory are not impaired. She does not express impulsivity or inappropriate judgment. She does not exhibit a depressed mood. She exhibits normal recent memory and normal remote memory.  Nursing note and vitals reviewed.  BP 98/62 mmHg  Pulse 75  Temp(Src) 98.6 F (37 C) (Oral)  Wt 105 lb 6.4 oz (47.809 kg)  SpO2 99%  LMP 12/08/2014 Wt Readings from Last 3 Encounters:  12/17/14 105 lb 6.4 oz (47.809 kg)  12/14/14 103 lb (46.72 kg)  12/13/14 103 lb (46.72 kg)     Lab Results  Component Value Date   WBC 5.4 11/21/2014   HGB 13.6 11/21/2014   HCT 41.4 11/21/2014   PLT 243.0 11/21/2014   GLUCOSE 66* 11/21/2014   CHOL 160 10/11/2014   TRIG 55.0 10/11/2014   HDL 48.10 10/11/2014   LDLCALC 101* 10/11/2014   ALT 24 11/21/2014   AST 18 11/21/2014   NA 140 11/21/2014   K 3.9 11/21/2014   CL 105 11/21/2014   CREATININE 0.74 11/21/2014   BUN 16 11/21/2014   CO2 28 11/21/2014   TSH 1.15 10/11/2014    Dg Hysterogram  05/20/2009   Clinical Data: Secondary infertility.   HYSTEROSALPINGOGRAM   Technique:  Hysterosalpingogram was performed by the ordering  physician under fluoroscopy.  Fluoroscopic images are submitted for interpretation following the procedure.   Fluoroscopy Time:  0.9 minutes.   Findings: The endometrial cavity of the uterus is normal in contour and appearance.   Contrast filling of both fallopian tubes is seen, and both tubes are normal in appearance.  Intraperitoneal spill of the contrast from both fallopian tubes is demonstrated.   IMPRESSION: Normal study.   Fallopian tubes are patent bilaterally.  Provider: Jerel Shepherd    Assessment & Plan:  Plan I have discontinued Ms. Timpone's ranitidine. I am also having her start on escitalopram. Additionally, I am having her maintain her omeprazole.  Meds ordered this encounter  Medications  . escitalopram (LEXAPRO) 10 MG tablet    Sig: 1 PO QHS    Dispense:  30 tablet    Refill:  2    Problem List Items Addressed This Visit    None    Visit Diagnoses    Generalized anxiety disorder    -  Primary    Relevant Medications    escitalopram (LEXAPRO) 10 MG tablet       Follow-up: Return in about 4 weeks (around 01/14/2015), or if symptoms worsen or fail to improve, for ANXIETY.  Garnet Koyanagi, DO

## 2014-12-17 NOTE — Telephone Encounter (Signed)
  Follow up Call-  Call back number 12/14/2014  Post procedure Call Back phone  # [phone]  Permission to leave phone message Yes     No answer and answering machine did not pick up to leave a message

## 2014-12-20 ENCOUNTER — Telehealth: Payer: Self-pay | Admitting: Family Medicine

## 2014-12-20 DIAGNOSIS — M899 Disorder of bone, unspecified: Secondary | ICD-10-CM | POA: Insufficient documentation

## 2014-12-20 MED ORDER — SERTRALINE HCL 25 MG PO TABS: 25.0000 mg | ORAL_TABLET | Freq: Every day | ORAL | Status: DC

## 2014-12-20 NOTE — Telephone Encounter (Signed)
Please advise      KP 

## 2014-12-20 NOTE — Telephone Encounter (Signed)
Can try zoloft 25 mg #30  1 po qd  2 refill May be PND -- ok to take antihistamine otc

## 2014-12-20 NOTE — Telephone Encounter (Signed)
Caller name:Tonica Samek Relationship to patient: Can be [phone] Pharm[facility]  Reason for call: Taking 1/2 of lexapro, pt is experiencing tiredness. Can she reduce the mg? Or try something else? Had Endoscopy on last Friday, was told problems were from stress. She wants to know if the lump in her throat could be caused from post nasal sinus drip? Ok to leave detail message if she doesn't answer.

## 2014-12-20 NOTE — Telephone Encounter (Signed)
Patient has been made aware and verbalized understanding. She will stop the Lexapro and start the Zoloft 25 and take an OTC antihistamine.     KP

## 2015-01-10 ENCOUNTER — Ambulatory Visit: Payer: BLUE CROSS/BLUE SHIELD | Admitting: Gastroenterology

## 2015-01-11 ENCOUNTER — Telehealth: Payer: Self-pay | Admitting: Family Medicine

## 2015-01-11 NOTE — Telephone Encounter (Signed)
Relation to KK:XFGH Call back [phone] Pharmacy: [id number] [place], [place] [phone] (Phone) [phone] (Fax)         Reason for call:  Patient requesting a refill omeprazole (PRILOSEC) 40 MG capsule

## 2015-01-11 NOTE — Telephone Encounter (Signed)
It was prescribed on 12/13/14 #60 with 6 refills by GI. She needs to call her pharmacy.      KP

## 2015-01-11 NOTE — Telephone Encounter (Signed)
lvm advising patient of message below °

## 2015-01-18 ENCOUNTER — Telehealth: Payer: Self-pay | Admitting: Family Medicine

## 2015-01-18 NOTE — Telephone Encounter (Signed)
Is she taking omeprazole?  If yes-- take bid and refer to GI

## 2015-01-18 NOTE — Telephone Encounter (Signed)
Caller name: Self   Can be reached: [phone]  Pharmacy: [id number] [place], [place] [address] [phone] (Phone) [phone] (Fax)         Reason for call: Having severe heartburn since Monday. Wants to know what she can do and also get refill on sertraline (ZOLOFT) 25 MG tablet [id number]. Please leave detailed message if patient does not answer

## 2015-01-18 NOTE — Telephone Encounter (Signed)
Detailed message left on the patient's VM as requested.     KP

## 2015-01-18 NOTE — Telephone Encounter (Signed)
Please advise      KP 

## 2015-01-23 ENCOUNTER — Encounter: Payer: Self-pay | Admitting: Internal Medicine

## 2015-01-23 ENCOUNTER — Ambulatory Visit (INDEPENDENT_AMBULATORY_CARE_PROVIDER_SITE_OTHER): Payer: BLUE CROSS/BLUE SHIELD | Admitting: Internal Medicine

## 2015-01-23 VITALS — BP 108/66 | HR 92 | Ht 62.0 in | Wt 107.0 lb

## 2015-01-23 DIAGNOSIS — R0789 Other chest pain: Secondary | ICD-10-CM

## 2015-01-23 DIAGNOSIS — R1013 Epigastric pain: Secondary | ICD-10-CM

## 2015-01-23 DIAGNOSIS — F458 Other somatoform disorders: Secondary | ICD-10-CM | POA: Diagnosis not present

## 2015-01-23 DIAGNOSIS — F411 Generalized anxiety disorder: Secondary | ICD-10-CM

## 2015-01-23 DIAGNOSIS — R0989 Other specified symptoms and signs involving the circulatory and respiratory systems: Secondary | ICD-10-CM

## 2015-01-23 MED ORDER — SERTRALINE HCL 50 MG PO TABS: 50.0000 mg | ORAL_TABLET | Freq: Every day | ORAL | Status: DC

## 2015-01-23 NOTE — Progress Notes (Signed)
HISTORY OF PRESENT ILLNESS:  Emily Kirby is a 49 y.o. female who was initially evaluated by GI 12/13/2014 regarding globus type sensation. See that dictation for details. Absolutely no reflux symptoms at that time. Patient states she had similar problems 8 years previous, was evaluated by ENT and told she had GERD. After 2 days of treatment for GERD, she states her problem resolved entirely. In any event, she underwent upper endoscopy 12/14/2014. Examination was entirely normal. Omeprazole 20 mg daily was empirically recommended for 1 month and return to the care of Dr. Etter Sjogren. She was seen on 12/17/2014 and prescribed Lexapro. She contacted the office several days later complaining of fatigue and on 12/20/2014 she was prescribed Zoloft 25 mg at night. She tells me that she went on vacation in [place] for 10 days and felt well without any symptoms. Upon returning to the [place] she requested omeprazole which was prescribed on 01/11/2015. She contacted their office 01/18/2015 complaining of 3 days of "heartburn". She was told to increase PPI to twice daily (total of 80 mg) and follow-up with GI. She presents today. Patient tells me that she has substernal burning and epigastric discomfort. This despite high-dose PPI. She has tried antiacid without improvement. She is tolerating Zoloft without side effects. I have reviewed her workup to date. She states that she never had burning discomfort or epigastric discomfort until just recently. No other complaints. She appears anxious   REVIEW OF SYSTEMS:  All non-GI ROS negative upon review  Past Medical History  Diagnosis Date  . Chicken pox   . Kidney stones   . AC (acromioclavicular) joint bone spurs   . Allergy     SEASONAL    Past Surgical History  Procedure Laterality Date  . Liposuction    . Cesarean section      Social History Jourdain Fradette  reports that she has never smoked. She has never used smokeless tobacco. She reports that she drinks  alcohol. She reports that she does not use illicit drugs.  family history includes COPD in her father; Cancer in her father; Hearing loss in her father; Heart disease in her father; Hypertension in her mother; Prostate cancer in her father.  Allergies  Allergen Reactions  . Codeine Other (See Comments)    Stomach upset  . Pseudoephedrine Other (See Comments)    Tingling all over body       PHYSICAL EXAMINATION: Vital signs: BP 108/66 mmHg  Pulse 92  Ht 5\' 2"  (1.575 m)  Wt 107 lb (48.535 kg)  BMI 19.57 kg/m2 General: Well-developed, well-nourished, no acute distress HEENT: Sclerae are anicteric, conjunctiva pink. Oral mucosa intact Lungs: Clear Heart: Regular Abdomen: soft, nontender, nondistended, no obvious ascites, no peritoneal signs, normal bowel sounds. No organomegaly. Extremities: No clubbing cyanosis or edema Psychiatric: Anxious, alert and oriented x3. Cooperative   ASSESSMENT:  #1. Complaints of epigastric and substernal burning discomfort with globus sensation. Almost certainly functional. I spent a great deal of time discussing this with her. This certainly is not reflux.   PLAN:  #1. Wean off omeprazole over the next week as instructed #2. Abdominal ultrasound to further evaluate epigastric discomfort and provide reassurance if negative #3. Instructed to increase Zoloft to 50 mg at night and follow-up with Dr. Etter Sjogren in 4 weeks. The increase in dosage at this time as she has tolerated the drug and been on the drug for little over 1 month without sustained response. If she does not tolerate the higher dose Zoloft, then  return to 25 mg and contact Dr. Etter Sjogren sooner  25 minutes was spent face-to-face with the patient. Greater than 50% of the time use for counseling regarding her GI complaints and the likely functional origin. As well treatment plan with regards to medication adjustments as outlined in follow-up as recommended

## 2015-01-23 NOTE — Patient Instructions (Signed)
You have been scheduled for an abdominal ultrasound at [facility] (1st floor of hospital) on 01/28/2015 at 8:00am. Please arrive 15 minutes prior to your appointment for registration. Make certain not to have anything to eat or drink 6 hours prior to your appointment. Should you need to reschedule your appointment, please contact radiology at [phone]. This test typically takes about 30 minutes to perform.  Per Dr. Henrene Pastor, wean yourself off the Omeprazole over the next week.  We have sent the following medications to your pharmacy for you to pick up at your convenience:  Zoloft - increased to 50mg  at bedtime  Follow up with Dr. Etter Sjogren

## 2015-01-28 ENCOUNTER — Ambulatory Visit (HOSPITAL_COMMUNITY)
Admission: RE | Admit: 2015-01-28 | Discharge: 2015-01-28 | Disposition: A | Discharge status: Home / Self Care | Payer: BLUE CROSS/BLUE SHIELD | Source: Ambulatory Visit | Attending: Internal Medicine | Admitting: Internal Medicine

## 2015-01-28 DIAGNOSIS — R1013 Epigastric pain: Secondary | ICD-10-CM

## 2015-01-28 DIAGNOSIS — R109 Unspecified abdominal pain: Secondary | ICD-10-CM | POA: Diagnosis not present

## 2015-01-28 DIAGNOSIS — R0789 Other chest pain: Secondary | ICD-10-CM

## 2015-02-12 ENCOUNTER — Telehealth: Payer: Self-pay

## 2015-02-12 ENCOUNTER — Encounter: Payer: Self-pay | Admitting: Family Medicine

## 2015-02-12 MED ORDER — SERTRALINE HCL 25 MG PO TABS: 25.0000 mg | ORAL_TABLET | Freq: Every day | ORAL | Status: DC

## 2015-02-12 NOTE — Telephone Encounter (Signed)
Med filled.  

## 2015-02-15 ENCOUNTER — Other Ambulatory Visit: Payer: Self-pay

## 2015-02-15 NOTE — Telephone Encounter (Signed)
Please see MyChart messages

## 2015-02-18 MED ORDER — SERTRALINE HCL 50 MG PO TABS: 50.0000 mg | ORAL_TABLET | Freq: Every day | ORAL | Status: DC

## 2015-02-18 NOTE — Addendum Note (Signed)
Addended by: Ewing Schlein on: 02/18/2015 04:59 PM   Modules accepted: Orders

## 2015-02-18 NOTE — Telephone Encounter (Signed)
Ok to cont 50 mg unless pt feels it needs to be 100 mg------ #30  2 refills -----  Needs ov

## 2015-02-18 NOTE — Telephone Encounter (Signed)
Please review and advise     KP 

## 2015-02-21 ENCOUNTER — Other Ambulatory Visit: Payer: Self-pay

## 2015-02-21 MED ORDER — SERTRALINE HCL 50 MG PO TABS: 50.0000 mg | ORAL_TABLET | Freq: Every day | ORAL | Status: DC

## 2015-03-13 ENCOUNTER — Encounter: Payer: Self-pay | Admitting: Family Medicine

## 2015-03-13 NOTE — Telephone Encounter (Signed)
Stop the zoloft to see if the symptoms improve

## 2015-03-19 ENCOUNTER — Other Ambulatory Visit: Payer: Self-pay | Admitting: Internal Medicine

## 2015-03-22 ENCOUNTER — Encounter: Payer: Self-pay | Admitting: Family Medicine

## 2015-03-23 ENCOUNTER — Other Ambulatory Visit: Payer: Self-pay | Admitting: Family Medicine

## 2015-03-23 DIAGNOSIS — F329 Major depressive disorder, single episode, unspecified: Secondary | ICD-10-CM

## 2015-03-23 DIAGNOSIS — F32A Depression, unspecified: Secondary | ICD-10-CM | POA: Insufficient documentation

## 2015-03-23 MED ORDER — VORTIOXETINE HBR 10 MG PO TABS: ORAL_TABLET | ORAL | Status: DC

## 2015-03-24 ENCOUNTER — Encounter: Payer: Self-pay | Admitting: Family Medicine

## 2015-03-25 NOTE — Telephone Encounter (Signed)
wellbutrin xl 150 mg #60  1po qd for 1 week then 2 po qd

## 2015-03-26 ENCOUNTER — Encounter: Payer: Self-pay | Admitting: Family Medicine

## 2015-03-26 MED ORDER — BUPROPION HCL ER (XL) 150 MG PO TB24: ORAL_TABLET | ORAL | Status: DC

## 2015-04-22 ENCOUNTER — Encounter: Payer: Self-pay | Admitting: Family Medicine

## 2015-04-22 MED ORDER — BUPROPION HCL ER (XL) 300 MG PO TB24: 300.0000 mg | ORAL_TABLET | Freq: Every day | ORAL | Status: DC

## 2015-04-22 NOTE — Telephone Encounter (Signed)
Ok to send 3 mon supply with 3 refills--- change to 300 mg

## 2015-05-11 ENCOUNTER — Other Ambulatory Visit: Payer: Self-pay | Admitting: Family Medicine

## 2015-05-22 ENCOUNTER — Other Ambulatory Visit: Payer: Self-pay | Admitting: Family Medicine

## 2015-06-15 ENCOUNTER — Other Ambulatory Visit: Payer: Self-pay | Admitting: Family Medicine

## 2015-07-26 ENCOUNTER — Encounter: Payer: Self-pay | Admitting: Internal Medicine

## 2015-10-22 ENCOUNTER — Other Ambulatory Visit: Payer: Self-pay | Admitting: Obstetrics and Gynecology

## 2015-10-22 DIAGNOSIS — N63 Unspecified lump in unspecified breast: Secondary | ICD-10-CM

## 2015-10-28 ENCOUNTER — Ambulatory Visit
Admission: RE | Admit: 2015-10-28 | Discharge: 2015-10-28 | Disposition: A | Discharge status: Home / Self Care | Payer: BLUE CROSS/BLUE SHIELD | Source: Ambulatory Visit | Attending: Obstetrics and Gynecology | Admitting: Obstetrics and Gynecology

## 2015-10-28 DIAGNOSIS — N63 Unspecified lump in unspecified breast: Secondary | ICD-10-CM

## 2015-11-12 ENCOUNTER — Other Ambulatory Visit: Payer: Self-pay

## 2015-11-12 ENCOUNTER — Encounter: Payer: Self-pay | Admitting: Family Medicine

## 2015-11-12 ENCOUNTER — Ambulatory Visit (INDEPENDENT_AMBULATORY_CARE_PROVIDER_SITE_OTHER): Payer: BLUE CROSS/BLUE SHIELD | Admitting: Family Medicine

## 2015-11-12 VITALS — BP 108/60 | HR 82 | Temp 98.5°F | Ht 62.0 in | Wt 105.4 lb

## 2015-11-12 DIAGNOSIS — Z0000 Encounter for general adult medical examination without abnormal findings: Secondary | ICD-10-CM

## 2015-11-12 DIAGNOSIS — Z0001 Encounter for general adult medical examination with abnormal findings: Secondary | ICD-10-CM | POA: Diagnosis not present

## 2015-11-12 DIAGNOSIS — N920 Excessive and frequent menstruation with regular cycle: Secondary | ICD-10-CM

## 2015-11-12 DIAGNOSIS — F329 Major depressive disorder, single episode, unspecified: Secondary | ICD-10-CM

## 2015-11-12 DIAGNOSIS — F32A Depression, unspecified: Secondary | ICD-10-CM

## 2015-11-12 LAB — POCT URINALYSIS DIPSTICK
Bilirubin, UA: NEGATIVE
GLUCOSE UA: NEGATIVE
Ketones, UA: NEGATIVE
Leukocytes, UA: NEGATIVE
NITRITE UA: NEGATIVE
PROTEIN UA: NEGATIVE
RBC UA: NEGATIVE
Spec Grav, UA: >=1.03
UROBILINOGEN UA: 0.2
pH, UA: 6

## 2015-11-12 MED ORDER — BUPROPION HCL ER (XL) 300 MG PO TB24: 300.0000 mg | ORAL_TABLET | Freq: Every day | ORAL | 3 refills | Status: DC

## 2015-11-12 NOTE — Progress Notes (Signed)
Subjective:     Emily Kirby is a 50 y.o. female and is here for a comprehensive physical exam. The patient reports problems - pt having a d and c on 9/21 for heavy periods and fibroid-- Dr Matthew Saras.  Social History   Social History  . Marital status: Married    Spouse name: N/A  . Number of children: N/A  . Years of education: N/A   Occupational History  .  [employer]    court interpreter   Social History Main Topics  . Smoking status: Never Smoker  . Smokeless tobacco: Never Used  . Alcohol use 0.0 oz/week     Comment:  3 X PER WEEK, HAVE NOT HAD ANY IN THE LAST 1 1/2  . Drug use: No  . Sexual activity: Yes    Partners: Male   Other Topics Concern  . Not on file   Social History Narrative  . No narrative on file   Health Maintenance  Topic Date Due  . COLONOSCOPY  09/12/2015  . INFLUENZA VACCINE  12/17/2015 (Originally 10/08/2015)  . HIV Screening  11/11/2016 (Originally [birth year]/11/1980)  . MAMMOGRAM  10/27/2016  . PAP SMEAR  10/09/2018  . TETANUS/TDAP  10/10/2023    The following portions of the patient's history were reviewed and updated as appropriate:  She  has a past medical history of AC (acromioclavicular) joint bone spurs; Allergy; Chicken pox; Kidney stones; and Uterine fibroid. She  does not have any pertinent problems on file. She  has a past surgical history that includes Liposuction and Cesarean section. Her family history includes COPD in her father; Cancer in her father; Hearing loss in her father; Heart disease in her father; Hypertension in her mother; Prostate cancer in her father. She  reports that she has never smoked. She has never used smokeless tobacco. She reports that she drinks alcohol. She reports that she does not use drugs. She has a current medication list which includes the following prescription(s): bupropion and progesterone. No current outpatient prescriptions on file prior to visit.   No current facility-administered  medications on file prior to visit.    She is allergic to codeine and pseudoephedrine..  Review of Systems Pertinent items are noted in HPI.   Objective:    BP 108/60 (BP Location: Left Arm, Patient Position: Sitting, Cuff Size: Small)   Pulse 82   Temp 98.5 F (36.9 C) (Oral)   Ht 5\' 2"  (1.575 m)   Wt 105 lb 6.4 oz (47.8 kg)   LMP 11/03/2015   SpO2 98%   BMI 19.28 kg/m  General appearance: alert, cooperative, appears stated age and no distress Head: Normocephalic, without obvious abnormality, atraumatic Eyes: conjunctivae/corneas clear. PERRL, EOM's intact. Fundi benign. Ears: normal TM's and external ear canals both ears Nose: Nares normal. Septum midline. Mucosa normal. No drainage or sinus tenderness. Throat: lips, mucosa, and tongue normal; teeth and gums normal Neck: no adenopathy, no carotid bruit, no JVD, supple, symmetrical, trachea midline and thyroid not enlarged, symmetric, no tenderness/mass/nodules Back: symmetric, no curvature. ROM normal. No CVA tenderness. Lungs: clear to auscultation bilaterally Breasts: normal appearance, no masses or tenderness Heart: regular rate and rhythm, S1, S2 normal, no murmur, click, rub or gallop Abdomen: soft, non-tender; bowel sounds normal; no masses,  no organomegaly Pelvic: deferred Extremities: extremities normal, atraumatic, no cyanosis or edema Pulses: 2+ and symmetric Skin: Skin color, texture, turgor normal. No rashes or lesions Lymph nodes: Cervical, supraclavicular, and axillary nodes normal. Neurologic:  Alert and oriented X 3, normal strength and tone. Normal symmetric reflexes. Normal coordination and gait    Assessment:    Healthy female exam.      Plan:    ghm utd Check labs See After Visit Summary for Counseling Recommendations    1. Preventative health care See above - Ambulatory referral to Gastroenterology - Lipid panel - CBC with Differential/Platelet - Comprehensive metabolic panel - TSH - POCT  urinalysis dipstick  2. Depression stable - buPROPion (WELLBUTRIN XL) 300 MG 24 hr tablet; Take 1 tablet (300 mg total) by mouth daily.  Dispense: 90 tablet; Refill: 3  3. Menorrhagia with regular cycle Per gyn - progesterone (PROMETRIUM) 200 MG capsule; 1 po day 12-24 th of every month

## 2015-11-12 NOTE — Progress Notes (Signed)
Pre visit review using our clinic review tool, if applicable. No additional management support is needed unless otherwise documented below in the visit note. 

## 2015-11-12 NOTE — Patient Instructions (Signed)
Preventive Care for Adults, Female A healthy lifestyle and preventive care can promote health and wellness. Preventive health guidelines for women include the following key practices.  A routine yearly physical is a good way to check with your health care provider about your health and preventive screening. It is a chance to share any concerns and updates on your health and to receive a thorough exam.  Visit your dentist for a routine exam and preventive care every 6 months. Brush your teeth twice a day and floss once a day. Good oral hygiene prevents tooth decay and gum disease.  The frequency of eye exams is based on your age, health, family medical history, use of contact lenses, and other factors. Follow your health care provider's recommendations for frequency of eye exams.  Eat a healthy diet. Foods like vegetables, fruits, whole grains, low-fat dairy products, and lean protein foods contain the nutrients you need without too many calories. Decrease your intake of foods high in solid fats, added sugars, and salt. Eat the right amount of calories for you.Get information about a proper diet from your health care provider, if necessary.  Regular physical exercise is one of the most important things you can do for your health. Most adults should get at least 150 minutes of moderate-intensity exercise (any activity that increases your heart rate and causes you to sweat) each week. In addition, most adults need muscle-strengthening exercises on 2 or more days a week.  Maintain a healthy weight. The body mass index (BMI) is a screening tool to identify possible weight problems. It provides an estimate of body fat based on height and weight. Your health care provider can find your BMI and can help you achieve or maintain a healthy weight.For adults 20 years and older:  A BMI below 18.5 is considered underweight.  A BMI of 18.5 to 24.9 is normal.  A BMI of 25 to 29.9 is considered overweight.  A  BMI of 30 and above is considered obese.  Maintain normal blood lipids and cholesterol levels by exercising and minimizing your intake of saturated fat. Eat a balanced diet with plenty of fruit and vegetables. Blood tests for lipids and cholesterol should begin at age 45 and be repeated every 5 years. If your lipid or cholesterol levels are high, you are over 50, or you are at high risk for heart disease, you may need your cholesterol levels checked more frequently.Ongoing high lipid and cholesterol levels should be treated with medicines if diet and exercise are not working.  If you smoke, find out from your health care provider how to quit. If you do not use tobacco, do not start.  Lung cancer screening is recommended for adults aged 45-80 years who are at high risk for developing lung cancer because of a history of smoking. A yearly low-dose CT scan of the lungs is recommended for people who have at least a 30-pack-year history of smoking and are a current smoker or have quit within the past 15 years. A pack year of smoking is smoking an average of 1 pack of cigarettes a day for 1 year (for example: 1 pack a day for 30 years or 2 packs a day for 15 years). Yearly screening should continue until the smoker has stopped smoking for at least 15 years. Yearly screening should be stopped for people who develop a health problem that would prevent them from having lung cancer treatment.  If you are pregnant, do not drink alcohol. If you are  breastfeeding, be very cautious about drinking alcohol. If you are not pregnant and choose to drink alcohol, do not have more than 1 drink per day. One drink is considered to be 12 ounces (355 mL) of beer, 5 ounces (148 mL) of wine, or 1.5 ounces (44 mL) of liquor.  Avoid use of street drugs. Do not share needles with anyone. Ask for help if you need support or instructions about stopping the use of drugs.  High blood pressure causes heart disease and increases the risk  of stroke. Your blood pressure should be checked at least every 1 to 2 years. Ongoing high blood pressure should be treated with medicines if weight loss and exercise do not work.  If you are 55-79 years old, ask your health care provider if you should take aspirin to prevent strokes.  Diabetes screening is done by taking a blood sample to check your blood glucose level after you have not eaten for a certain period of time (fasting). If you are not overweight and you do not have risk factors for diabetes, you should be screened once every 3 years starting at age 45. If you are overweight or obese and you are 40-70 years of age, you should be screened for diabetes every year as part of your cardiovascular risk assessment.  Breast cancer screening is essential preventive care for women. You should practice "breast self-awareness." This means understanding the normal appearance and feel of your breasts and may include breast self-examination. Any changes detected, no matter how small, should be reported to a health care provider. Women in their 20s and 30s should have a clinical breast exam (CBE) by a health care provider as part of a regular health exam every 1 to 3 years. After age 40, women should have a CBE every year. Starting at age 40, women should consider having a mammogram (breast X-ray test) every year. Women who have a family history of breast cancer should talk to their health care provider about genetic screening. Women at a high risk of breast cancer should talk to their health care providers about having an MRI and a mammogram every year.  Breast cancer gene (BRCA)-related cancer risk assessment is recommended for women who have family members with BRCA-related cancers. BRCA-related cancers include breast, ovarian, tubal, and peritoneal cancers. Having family members with these cancers may be associated with an increased risk for harmful changes (mutations) in the breast cancer genes BRCA1 and  BRCA2. Results of the assessment will determine the need for genetic counseling and BRCA1 and BRCA2 testing.  Your health care provider may recommend that you be screened regularly for cancer of the pelvic organs (ovaries, uterus, and vagina). This screening involves a pelvic examination, including checking for microscopic changes to the surface of your cervix (Pap test). You may be encouraged to have this screening done every 3 years, beginning at age 21.  For women ages 30-65, health care providers may recommend pelvic exams and Pap testing every 3 years, or they may recommend the Pap and pelvic exam, combined with testing for human papilloma virus (HPV), every 5 years. Some types of HPV increase your risk of cervical cancer. Testing for HPV may also be done on women of any age with unclear Pap test results.  Other health care providers may not recommend any screening for nonpregnant women who are considered low risk for pelvic cancer and who do not have symptoms. Ask your health care provider if a screening pelvic exam is right for   you.  If you have had past treatment for cervical cancer or a condition that could lead to cancer, you need Pap tests and screening for cancer for at least 20 years after your treatment. If Pap tests have been discontinued, your risk factors (such as having a new sexual partner) need to be reassessed to determine if screening should resume. Some women have medical problems that increase the chance of getting cervical cancer. In these cases, your health care provider may recommend more frequent screening and Pap tests.  Colorectal cancer can be detected and often prevented. Most routine colorectal cancer screening begins at the age of 50 years and continues through age 75 years. However, your health care provider may recommend screening at an earlier age if you have risk factors for colon cancer. On a yearly basis, your health care provider may provide home test kits to check  for hidden blood in the stool. Use of a small camera at the end of a tube, to directly examine the colon (sigmoidoscopy or colonoscopy), can detect the earliest forms of colorectal cancer. Talk to your health care provider about this at age 50, when routine screening begins. Direct exam of the colon should be repeated every 5-10 years through age 75 years, unless early forms of precancerous polyps or small growths are found.  People who are at an increased risk for hepatitis B should be screened for this virus. You are considered at high risk for hepatitis B if:  You were born in a country where hepatitis B occurs often. Talk with your health care provider about which countries are considered high risk.  Your parents were born in a high-risk country and you have not received a shot to protect against hepatitis B (hepatitis B vaccine).  You have HIV or AIDS.  You use needles to inject street drugs.  You live with, or have sex with, someone who has hepatitis B.  You get hemodialysis treatment.  You take certain medicines for conditions like cancer, organ transplantation, and autoimmune conditions.  Hepatitis C blood testing is recommended for all people born from 1945 through 1965 and any individual with known risks for hepatitis C.  Practice safe sex. Use condoms and avoid high-risk sexual practices to reduce the spread of sexually transmitted infections (STIs). STIs include gonorrhea, chlamydia, syphilis, trichomonas, herpes, HPV, and human immunodeficiency virus (HIV). Herpes, HIV, and HPV are viral illnesses that have no cure. They can result in disability, cancer, and death.  You should be screened for sexually transmitted illnesses (STIs) including gonorrhea and chlamydia if:  You are sexually active and are younger than 24 years.  You are older than 24 years and your health care provider tells you that you are at risk for this type of infection.  Your sexual activity has changed  since you were last screened and you are at an increased risk for chlamydia or gonorrhea. Ask your health care provider if you are at risk.  If you are at risk of being infected with HIV, it is recommended that you take a prescription medicine daily to prevent HIV infection. This is called preexposure prophylaxis (PrEP). You are considered at risk if:  You are sexually active and do not regularly use condoms or know the HIV status of your partner(s).  You take drugs by injection.  You are sexually active with a partner who has HIV.  Talk with your health care provider about whether you are at high risk of being infected with HIV. If   you choose to begin PrEP, you should first be tested for HIV. You should then be tested every 3 months for as long as you are taking PrEP.  Osteoporosis is a disease in which the bones lose minerals and strength with aging. This can result in serious bone fractures or breaks. The risk of osteoporosis can be identified using a bone density scan. Women ages 67 years and over and women at risk for fractures or osteoporosis should discuss screening with their health care providers. Ask your health care provider whether you should take a calcium supplement or vitamin D to reduce the rate of osteoporosis.  Menopause can be associated with physical symptoms and risks. Hormone replacement therapy is available to decrease symptoms and risks. You should talk to your health care provider about whether hormone replacement therapy is right for you.  Use sunscreen. Apply sunscreen liberally and repeatedly throughout the day. You should seek shade when your shadow is shorter than you. Protect yourself by wearing long sleeves, pants, a wide-brimmed hat, and sunglasses year round, whenever you are outdoors.  Once a month, do a whole body skin exam, using a mirror to look at the skin on your back. Tell your health care provider of new moles, moles that have irregular borders, moles that  are larger than a pencil eraser, or moles that have changed in shape or color.  Stay current with required vaccines (immunizations).  Influenza vaccine. All adults should be immunized every year.  Tetanus, diphtheria, and acellular pertussis (Td, Tdap) vaccine. Pregnant women should receive 1 dose of Tdap vaccine during each pregnancy. The dose should be obtained regardless of the length of time since the last dose. Immunization is preferred during the 27th-36th week of gestation. An adult who has not previously received Tdap or who does not know her vaccine status should receive 1 dose of Tdap. This initial dose should be followed by tetanus and diphtheria toxoids (Td) booster doses every 10 years. Adults with an unknown or incomplete history of completing a 3-dose immunization series with Td-containing vaccines should begin or complete a primary immunization series including a Tdap dose. Adults should receive a Td booster every 10 years.  Varicella vaccine. An adult without evidence of immunity to varicella should receive 2 doses or a second dose if she has previously received 1 dose. Pregnant females who do not have evidence of immunity should receive the first dose after pregnancy. This first dose should be obtained before leaving the health care facility. The second dose should be obtained 4-8 weeks after the first dose.  Human papillomavirus (HPV) vaccine. Females aged 13-26 years who have not received the vaccine previously should obtain the 3-dose series. The vaccine is not recommended for use in pregnant females. However, pregnancy testing is not needed before receiving a dose. If a female is found to be pregnant after receiving a dose, no treatment is needed. In that case, the remaining doses should be delayed until after the pregnancy. Immunization is recommended for any person with an immunocompromised condition through the age of 61 years if she did not get any or all doses earlier. During the  3-dose series, the second dose should be obtained 4-8 weeks after the first dose. The third dose should be obtained 24 weeks after the first dose and 16 weeks after the second dose.  Zoster vaccine. One dose is recommended for adults aged 30 years or older unless certain conditions are present.  Measles, mumps, and rubella (MMR) vaccine. Adults born  before 1957 generally are considered immune to measles and mumps. Adults born in 1957 or later should have 1 or more doses of MMR vaccine unless there is a contraindication to the vaccine or there is laboratory evidence of immunity to each of the three diseases. A routine second dose of MMR vaccine should be obtained at least 28 days after the first dose for students attending postsecondary schools, health care workers, or international travelers. People who received inactivated measles vaccine or an unknown type of measles vaccine during 1963-1967 should receive 2 doses of MMR vaccine. People who received inactivated mumps vaccine or an unknown type of mumps vaccine before 1979 and are at high risk for mumps infection should consider immunization with 2 doses of MMR vaccine. For females of childbearing age, rubella immunity should be determined. If there is no evidence of immunity, females who are not pregnant should be vaccinated. If there is no evidence of immunity, females who are pregnant should delay immunization until after pregnancy. Unvaccinated health care workers born before 1957 who lack laboratory evidence of measles, mumps, or rubella immunity or laboratory confirmation of disease should consider measles and mumps immunization with 2 doses of MMR vaccine or rubella immunization with 1 dose of MMR vaccine.  Pneumococcal 13-valent conjugate (PCV13) vaccine. When indicated, a person who is uncertain of his immunization history and has no record of immunization should receive the PCV13 vaccine. All adults 65 years of age and older should receive this  vaccine. An adult aged 19 years or older who has certain medical conditions and has not been previously immunized should receive 1 dose of PCV13 vaccine. This PCV13 should be followed with a dose of pneumococcal polysaccharide (PPSV23) vaccine. Adults who are at high risk for pneumococcal disease should obtain the PPSV23 vaccine at least 8 weeks after the dose of PCV13 vaccine. Adults older than 50 years of age who have normal immune system function should obtain the PPSV23 vaccine dose at least 1 year after the dose of PCV13 vaccine.  Pneumococcal polysaccharide (PPSV23) vaccine. When PCV13 is also indicated, PCV13 should be obtained first. All adults aged 65 years and older should be immunized. An adult younger than age 65 years who has certain medical conditions should be immunized. Any person who resides in a nursing home or long-term care facility should be immunized. An adult smoker should be immunized. People with an immunocompromised condition and certain other conditions should receive both PCV13 and PPSV23 vaccines. People with human immunodeficiency virus (HIV) infection should be immunized as soon as possible after diagnosis. Immunization during chemotherapy or radiation therapy should be avoided. Routine use of PPSV23 vaccine is not recommended for American Indians, [place] Natives, or people younger than 65 years unless there are medical conditions that require PPSV23 vaccine. When indicated, people who have unknown immunization and have no record of immunization should receive PPSV23 vaccine. One-time revaccination 5 years after the first dose of PPSV23 is recommended for people aged 19-64 years who have chronic kidney failure, nephrotic syndrome, asplenia, or immunocompromised conditions. People who received 1-2 doses of PPSV23 before age 65 years should receive another dose of PPSV23 vaccine at age 65 years or later if at least 5 years have passed since the previous dose. Doses of PPSV23 are not  needed for people immunized with PPSV23 at or after age 65 years.  Meningococcal vaccine. Adults with asplenia or persistent complement component deficiencies should receive 2 doses of quadrivalent meningococcal conjugate (MenACWY-D) vaccine. The doses should be obtained   at least 2 months apart. Microbiologists working with certain meningococcal bacteria, [place] recruits, people at risk during an outbreak, and people who travel to or live in countries with a high rate of meningitis should be immunized. A first-year college student up through age 34 years who is living in a residence hall should receive a dose if she did not receive a dose on or after her 16th birthday. Adults who have certain high-risk conditions should receive one or more doses of vaccine.  Hepatitis A vaccine. Adults who wish to be protected from this disease, have certain high-risk conditions, work with hepatitis A-infected animals, work in hepatitis A research labs, or travel to or work in countries with a high rate of hepatitis A should be immunized. Adults who were previously unvaccinated and who anticipate close contact with an international adoptee during the first 60 days after arrival in the [place] from a country with a high rate of hepatitis A should be immunized.  Hepatitis B vaccine. Adults who wish to be protected from this disease, have certain high-risk conditions, may be exposed to blood or other infectious body fluids, are household contacts or sex partners of hepatitis B positive people, are clients or workers in certain care facilities, or travel to or work in countries with a high rate of hepatitis B should be immunized.  Haemophilus influenzae type b (Hib) vaccine. A previously unvaccinated person with asplenia or sickle cell disease or having a scheduled splenectomy should receive 1 dose of Hib vaccine. Regardless of previous immunization, a recipient of a hematopoietic stem cell transplant should receive a  3-dose series 6-12 months after her successful transplant. Hib vaccine is not recommended for adults with HIV infection. Preventive Services / Frequency Ages 35 to 4 years  Blood pressure check.** / Every 3-5 years.  Lipid and cholesterol check.** / Every 5 years beginning at age 60.  Clinical breast exam.** / Every 3 years for women in their 71s and 10s.  BRCA-related cancer risk assessment.** / For women who have family members with a BRCA-related cancer (breast, ovarian, tubal, or peritoneal cancers).  Pap test.** / Every 2 years from ages 76 through 26. Every 3 years starting at age 61 through age 76 or 93 with a history of 3 consecutive normal Pap tests.  HPV screening.** / Every 3 years from ages 37 through ages 60 to 51 with a history of 3 consecutive normal Pap tests.  Hepatitis C blood test.** / For any individual with known risks for hepatitis C.  Skin self-exam. / Monthly.  Influenza vaccine. / Every year.  Tetanus, diphtheria, and acellular pertussis (Tdap, Td) vaccine.** / Consult your health care provider. Pregnant women should receive 1 dose of Tdap vaccine during each pregnancy. 1 dose of Td every 10 years.  Varicella vaccine.** / Consult your health care provider. Pregnant females who do not have evidence of immunity should receive the first dose after pregnancy.  HPV vaccine. / 3 doses over 6 months, if 93 and younger. The vaccine is not recommended for use in pregnant females. However, pregnancy testing is not needed before receiving a dose.  Measles, mumps, rubella (MMR) vaccine.** / You need at least 1 dose of MMR if you were born in 1957 or later. You may also need a 2nd dose. For females of childbearing age, rubella immunity should be determined. If there is no evidence of immunity, females who are not pregnant should be vaccinated. If there is no evidence of immunity, females who are  pregnant should delay immunization until after pregnancy.  Pneumococcal  13-valent conjugate (PCV13) vaccine.** / Consult your health care provider.  Pneumococcal polysaccharide (PPSV23) vaccine.** / 1 to 2 doses if you smoke cigarettes or if you have certain conditions.  Meningococcal vaccine.** / 1 dose if you are age 68 to 8 years and a Market researcher living in a residence hall, or have one of several medical conditions, you need to get vaccinated against meningococcal disease. You may also need additional booster doses.  Hepatitis A vaccine.** / Consult your health care provider.  Hepatitis B vaccine.** / Consult your health care provider.  Haemophilus influenzae type b (Hib) vaccine.** / Consult your health care provider. Ages 7 to 53 years  Blood pressure check.** / Every year.  Lipid and cholesterol check.** / Every 5 years beginning at age 25 years.  Lung cancer screening. / Every year if you are aged 11-80 years and have a 30-pack-year history of smoking and currently smoke or have quit within the past 15 years. Yearly screening is stopped once you have quit smoking for at least 15 years or develop a health problem that would prevent you from having lung cancer treatment.  Clinical breast exam.** / Every year after age 48 years.  BRCA-related cancer risk assessment.** / For women who have family members with a BRCA-related cancer (breast, ovarian, tubal, or peritoneal cancers).  Mammogram.** / Every year beginning at age 41 years and continuing for as long as you are in good health. Consult with your health care provider.  Pap test.** / Every 3 years starting at age 65 years through age 37 or 70 years with a history of 3 consecutive normal Pap tests.  HPV screening.** / Every 3 years from ages 72 years through ages 60 to 40 years with a history of 3 consecutive normal Pap tests.  Fecal occult blood test (FOBT) of stool. / Every year beginning at age 21 years and continuing until age 5 years. You may not need to do this test if you get  a colonoscopy every 10 years.  Flexible sigmoidoscopy or colonoscopy.** / Every 5 years for a flexible sigmoidoscopy or every 10 years for a colonoscopy beginning at age 35 years and continuing until age 48 years.  Hepatitis C blood test.** / For all people born from [birth year] through 1965 and any individual with known risks for hepatitis C.  Skin self-exam. / Monthly.  Influenza vaccine. / Every year.  Tetanus, diphtheria, and acellular pertussis (Tdap/Td) vaccine.** / Consult your health care provider. Pregnant women should receive 1 dose of Tdap vaccine during each pregnancy. 1 dose of Td every 10 years.  Varicella vaccine.** / Consult your health care provider. Pregnant females who do not have evidence of immunity should receive the first dose after pregnancy.  Zoster vaccine.** / 1 dose for adults aged 30 years or older.  Measles, mumps, rubella (MMR) vaccine.** / You need at least 1 dose of MMR if you were born in 1957 or later. You may also need a second dose. For females of childbearing age, rubella immunity should be determined. If there is no evidence of immunity, females who are not pregnant should be vaccinated. If there is no evidence of immunity, females who are pregnant should delay immunization until after pregnancy.  Pneumococcal 13-valent conjugate (PCV13) vaccine.** / Consult your health care provider.  Pneumococcal polysaccharide (PPSV23) vaccine.** / 1 to 2 doses if you smoke cigarettes or if you have certain conditions.  Meningococcal vaccine.** /  Consult your health care provider.  Hepatitis A vaccine.** / Consult your health care provider.  Hepatitis B vaccine.** / Consult your health care provider.  Haemophilus influenzae type b (Hib) vaccine.** / Consult your health care provider. Ages 64 years and over  Blood pressure check.** / Every year.  Lipid and cholesterol check.** / Every 5 years beginning at age 23 years.  Lung cancer screening. / Every year if you  are aged 16-80 years and have a 30-pack-year history of smoking and currently smoke or have quit within the past 15 years. Yearly screening is stopped once you have quit smoking for at least 15 years or develop a health problem that would prevent you from having lung cancer treatment.  Clinical breast exam.** / Every year after age 74 years.  BRCA-related cancer risk assessment.** / For women who have family members with a BRCA-related cancer (breast, ovarian, tubal, or peritoneal cancers).  Mammogram.** / Every year beginning at age 44 years and continuing for as long as you are in good health. Consult with your health care provider.  Pap test.** / Every 3 years starting at age 58 years through age 22 or 39 years with 3 consecutive normal Pap tests. Testing can be stopped between 65 and 70 years with 3 consecutive normal Pap tests and no abnormal Pap or HPV tests in the past 10 years.  HPV screening.** / Every 3 years from ages 64 years through ages 70 or 61 years with a history of 3 consecutive normal Pap tests. Testing can be stopped between 65 and 70 years with 3 consecutive normal Pap tests and no abnormal Pap or HPV tests in the past 10 years.  Fecal occult blood test (FOBT) of stool. / Every year beginning at age 40 years and continuing until age 27 years. You may not need to do this test if you get a colonoscopy every 10 years.  Flexible sigmoidoscopy or colonoscopy.** / Every 5 years for a flexible sigmoidoscopy or every 10 years for a colonoscopy beginning at age 7 years and continuing until age 32 years.  Hepatitis C blood test.** / For all people born from [birth year] through 1965 and any individual with known risks for hepatitis C.  Osteoporosis screening.** / A one-time screening for women ages 30 years and over and women at risk for fractures or osteoporosis.  Skin self-exam. / Monthly.  Influenza vaccine. / Every year.  Tetanus, diphtheria, and acellular pertussis (Tdap/Td)  vaccine.** / 1 dose of Td every 10 years.  Varicella vaccine.** / Consult your health care provider.  Zoster vaccine.** / 1 dose for adults aged 35 years or older.  Pneumococcal 13-valent conjugate (PCV13) vaccine.** / Consult your health care provider.  Pneumococcal polysaccharide (PPSV23) vaccine.** / 1 dose for all adults aged 46 years and older.  Meningococcal vaccine.** / Consult your health care provider.  Hepatitis A vaccine.** / Consult your health care provider.  Hepatitis B vaccine.** / Consult your health care provider.  Haemophilus influenzae type b (Hib) vaccine.** / Consult your health care provider. ** Family history and personal history of risk and conditions may change your health care provider's recommendations.   This information is not intended to replace advice given to you by your health care provider. Make sure you discuss any questions you have with your health care provider.   Document Released: 04/21/2001 Document Revised: 03/16/2014 Document Reviewed: 07/21/2010 Elsevier Interactive Patient Education [employer].

## 2015-11-13 LAB — COMPREHENSIVE METABOLIC PANEL
ALBUMIN: 4.6 g/dL (ref 3.5–5.2)
ALK PHOS: 47 U/L (ref 39–117)
ALT: 19 U/L (ref 0–35)
AST: 20 U/L (ref 0–37)
BUN: 14 mg/dL (ref 6–23)
CALCIUM: 9.2 mg/dL (ref 8.4–10.5)
CO2: 32 mEq/L (ref 19–32)
Chloride: 102 mEq/L (ref 96–112)
Creatinine, Ser: 0.68 mg/dL (ref 0.40–1.20)
GFR: 97.28 mL/min (ref >60.00)
Glucose, Bld: 100 mg/dL — ABNORMAL HIGH (ref 70–99)
POTASSIUM: 3.9 meq/L (ref 3.5–5.1)
Sodium: 138 mEq/L (ref 135–145)
TOTAL PROTEIN: 7.3 g/dL (ref 6.0–8.3)
Total Bilirubin: 0.3 mg/dL (ref 0.2–1.2)

## 2015-11-13 LAB — TSH: TSH: 1.93 u[IU]/mL (ref 0.35–4.50)

## 2015-11-13 LAB — CBC WITH DIFFERENTIAL/PLATELET
BASOS PCT: 0.6 % (ref 0.0–3.0)
Basophils Absolute: 0 10*3/uL (ref 0.0–0.1)
EOS PCT: 1.7 % (ref 0.0–5.0)
Eosinophils Absolute: 0.1 10*3/uL (ref 0.0–0.7)
HCT: 41.6 % (ref 36.0–46.0)
HEMOGLOBIN: 14 g/dL (ref 12.0–15.0)
Lymphocytes Relative: 25.6 % (ref 12.0–46.0)
Lymphs Abs: 1.5 10*3/uL (ref 0.7–4.0)
MCHC: 33.7 g/dL (ref 30.0–36.0)
MCV: 83.5 fl (ref 78.0–100.0)
MONOS PCT: 5.1 % (ref 3.0–12.0)
Monocytes Absolute: 0.3 10*3/uL (ref 0.1–1.0)
Neutro Abs: 3.8 10*3/uL (ref 1.4–7.7)
Neutrophils Relative %: 67 % (ref 43.0–77.0)
Platelets: 273 10*3/uL (ref 150.0–400.0)
RBC: 4.98 Mil/uL (ref 3.87–5.11)
RDW: 14.2 % (ref 11.5–15.5)
WBC: 5.7 10*3/uL (ref 4.0–10.5)

## 2015-11-13 LAB — LIPID PANEL
CHOLESTEROL: 161 mg/dL (ref 0–200)
HDL: 61 mg/dL (ref >39.00)
LDL Cholesterol: 87 mg/dL (ref 0–99)
NONHDL: 100.26
Total CHOL/HDL Ratio: 3
Triglycerides: 66 mg/dL (ref 0.0–149.0)
VLDL: 13.2 mg/dL (ref 0.0–40.0)

## 2015-12-05 ENCOUNTER — Other Ambulatory Visit: Payer: Self-pay | Admitting: Obstetrics and Gynecology

## 2015-12-05 DIAGNOSIS — N6001 Solitary cyst of right breast: Secondary | ICD-10-CM

## 2015-12-06 ENCOUNTER — Ambulatory Visit
Admission: RE | Admit: 2015-12-06 | Discharge: 2015-12-06 | Disposition: A | Discharge status: Home / Self Care | Payer: BLUE CROSS/BLUE SHIELD | Source: Ambulatory Visit | Attending: Obstetrics and Gynecology | Admitting: Obstetrics and Gynecology

## 2015-12-06 DIAGNOSIS — N6001 Solitary cyst of right breast: Secondary | ICD-10-CM

## 2015-12-06 HISTORY — PX: BREAST CYST ASPIRATION: SHX578

## 2015-12-09 ENCOUNTER — Other Ambulatory Visit: Payer: BLUE CROSS/BLUE SHIELD

## 2015-12-12 ENCOUNTER — Encounter: Payer: Self-pay | Admitting: Family Medicine

## 2016-04-14 ENCOUNTER — Encounter: Payer: Self-pay | Admitting: Internal Medicine

## 2016-06-15 ENCOUNTER — Ambulatory Visit (AMBULATORY_SURGERY_CENTER): Payer: Self-pay

## 2016-06-15 VITALS — Ht 62.0 in | Wt 108.0 lb

## 2016-06-15 DIAGNOSIS — Z1211 Encounter for screening for malignant neoplasm of colon: Secondary | ICD-10-CM

## 2016-06-15 MED ORDER — SUPREP BOWEL PREP KIT 17.5-3.13-1.6 GM/177ML PO SOLN: 1.0000 | Freq: Once | ORAL | 0 refills | Status: AC

## 2016-06-15 NOTE — Progress Notes (Signed)
No allergies to eggs or soy No diet meds No home oxygen No past problems with anesthesia  Registered emmi 

## 2016-06-16 ENCOUNTER — Encounter: Payer: Self-pay | Admitting: Internal Medicine

## 2016-06-29 ENCOUNTER — Ambulatory Visit (AMBULATORY_SURGERY_CENTER): Payer: BC Managed Care – PPO | Admitting: Internal Medicine

## 2016-06-29 ENCOUNTER — Encounter: Payer: Self-pay | Admitting: Internal Medicine

## 2016-06-29 VITALS — BP 109/69 | HR 73 | Temp 98.6°F | Resp 12 | Ht 62.0 in | Wt 108.0 lb

## 2016-06-29 DIAGNOSIS — Z1211 Encounter for screening for malignant neoplasm of colon: Secondary | ICD-10-CM | POA: Diagnosis not present

## 2016-06-29 DIAGNOSIS — D124 Benign neoplasm of descending colon: Secondary | ICD-10-CM | POA: Diagnosis not present

## 2016-06-29 DIAGNOSIS — Z1212 Encounter for screening for malignant neoplasm of rectum: Secondary | ICD-10-CM | POA: Diagnosis not present

## 2016-06-29 DIAGNOSIS — D123 Benign neoplasm of transverse colon: Secondary | ICD-10-CM

## 2016-06-29 DIAGNOSIS — K635 Polyp of colon: Secondary | ICD-10-CM | POA: Diagnosis not present

## 2016-06-29 MED ORDER — SODIUM CHLORIDE 0.9 % IV SOLN: 500.0000 mL | INTRAVENOUS | Status: DC

## 2016-06-29 NOTE — Progress Notes (Signed)
Called to room to assist during endoscopic procedure.  Patient ID and intended procedure confirmed with present staff. Received instructions for my participation in the procedure from the performing physician.  

## 2016-06-29 NOTE — Progress Notes (Signed)
Pt's states no medical or surgical changes since previsit or office visit. 

## 2016-06-29 NOTE — Progress Notes (Signed)
Report to PACU, RN, vss, BBS= Clear.  

## 2016-06-29 NOTE — Op Note (Signed)
[place] [facility] Patient Name: Emily Kirby Procedure Date: 06/29/2016 9:55 AM MRN: [id number] Endoscopist: Docia Chuck. Henrene Pastor , MD Age: 51 Referring MD:  Date of Birth: 1966/03/07 Gender: Female Account #: [id number] Procedure:                Colonoscopy with cold snare x 2 Indications:              Screening for colorectal malignant neoplasm Medicines:                Monitored Anesthesia Care Procedure:                Pre-Anesthesia Assessment:                           - Prior to the procedure, a History and Physical                            was performed, and patient medications and                            allergies were reviewed. The patient's tolerance of                            previous anesthesia was also reviewed. The risks                            and benefits of the procedure and the sedation                            options and risks were discussed with the patient.                            All questions were answered, and informed consent                            was obtained. Prior Anticoagulants: The patient has                            taken no previous anticoagulant or antiplatelet                            agents. ASA Grade Assessment: I - A normal, healthy                            patient. After reviewing the risks and benefits,                            the patient was deemed in satisfactory condition to                            undergo the procedure.                           After obtaining informed consent, the colonoscope  was passed under direct vision. Throughout the                            procedure, the patient's blood pressure, pulse, and                            oxygen saturations were monitored continuously. The                            Model CF-HQ190L [id number]) scope was introduced                            through the anus and advanced to the the cecum,                            identified by  appendiceal orifice and ileocecal                            valve. The ileocecal valve, appendiceal orifice,                            and rectum were photographed. The quality of the                            bowel preparation was excellent. The colonoscopy                            was performed without difficulty. The patient                            tolerated the procedure well. The bowel preparation                            used was SUPREP. Scope In: 10:11:13 AM Scope Out: 10:27:01 AM Scope Withdrawal Time: 0 hours 13 minutes 20 seconds  Total Procedure Duration: 0 hours 15 minutes 48 seconds  Findings:                 Two polyps were found in the descending colon and                            transverse colon. The polyps were 2 to 4 mm in                            size. These polyps were removed with a cold snare.                            Resection and retrieval were complete.                           Internal hemorrhoids were found during                            retroflexion. The hemorrhoids were small.  The exam was otherwise without abnormality on                            direct and retroflexion views. Complications:            No immediate complications. Estimated blood loss:                            None. Estimated Blood Loss:     Estimated blood loss: none. Impression:               - Two 2 to 4 mm polyps in the descending colon and                            in the transverse colon, removed with a cold snare.                            Resected and retrieved.                           - Internal hemorrhoids.                           - The examination was otherwise normal on direct                            and retroflexion views. Recommendation:           - Repeat colonoscopy in 5-10 years for surveillance.                           - Patient has a contact number available for                            emergencies. The signs  and symptoms of potential                            delayed complications were discussed with the                            patient. Return to normal activities tomorrow.                            Written discharge instructions were provided to the                            patient.                           - Resume previous diet.                           - Continue present medications.                           - Await pathology results. Docia Chuck. Henrene Pastor, MD 06/29/2016 10:32:29 AM This report has been signed electronically.

## 2016-06-29 NOTE — Patient Instructions (Signed)
YOU HAD AN ENDOSCOPIC PROCEDURE TODAY AT [facility]:   Refer to the procedure report that was given to you for any specific questions about what was found during the examination.  If the procedure report does not answer your questions, please call your gastroenterologist to clarify.  If you requested that your care partner not be given the details of your procedure findings, then the procedure report has been included in a sealed envelope for you to review at your convenience later.  YOU SHOULD EXPECT: Some feelings of bloating in the abdomen. Passage of more gas than usual.  Walking can help get rid of the air that was put into your GI tract during the procedure and reduce the bloating. If you had a lower endoscopy (such as a colonoscopy or flexible sigmoidoscopy) you may notice spotting of blood in your stool or on the toilet paper. If you underwent a bowel prep for your procedure, you may not have a normal bowel movement for a few days.  Please Note:  You might notice some irritation and congestion in your nose or some drainage.  This is from the oxygen used during your procedure.  There is no need for concern and it should clear up in a day or so.  SYMPTOMS TO REPORT IMMEDIATELY:   Following lower endoscopy (colonoscopy or flexible sigmoidoscopy):  Excessive amounts of blood in the stool  Significant tenderness or worsening of abdominal pains  Swelling of the abdomen that is new, acute  Fever of 100F or higher   For urgent or emergent issues, a gastroenterologist can be reached at any hour by calling [phone].  Please read all handouts given to you by your recovery nurse.   DIET:  We do recommend a small meal at first, but then you may proceed to your regular diet.  Drink plenty of fluids but you should avoid alcoholic beverages for 24 hours.  ACTIVITY:  You should plan to take it easy for the rest of today and you should NOT DRIVE or use heavy machinery until  tomorrow (because of the sedation medicines used during the test).    FOLLOW UP: Our staff will call the number listed on your records the next business day following your procedure to check on you and address any questions or concerns that you may have regarding the information given to you following your procedure. If we do not reach you, we will leave a message.  However, if you are feeling well and you are not experiencing any problems, there is no need to return our call.  We will assume that you have returned to your regular daily activities without incident.  If any biopsies were taken you will be contacted by phone or by letter within the next 1-3 weeks.  Please call us at [phone] if you have not heard about the biopsies in 3 weeks.    SIGNATURES/CONFIDENTIALITY: You and/or your care partner have signed paperwork which will be entered into your electronic medical record.  These signatures attest to the fact that that the information above on your After Visit Summary has been reviewed and is understood.  Full responsibility of the confidentiality of this discharge information lies with you and/or your care-partner.  Thank you for letting us take are of your healthcare needs today.

## 2016-06-30 ENCOUNTER — Telehealth: Payer: Self-pay | Admitting: *Deleted

## 2016-06-30 NOTE — Telephone Encounter (Signed)
  Follow up Call-  Call back number 06/29/2016 12/14/2014  Post procedure Call Back phone  # [phone]  Permission to leave phone message Yes Yes  Some recent data might be hidden     Patient questions:  Do you have a fever, pain , or abdominal swelling? No. Pain Score  0 *  Have you tolerated food without any problems? Yes.    Have you been able to return to your normal activities? Yes.    Do you have any questions about your discharge instructions: Diet   No. Medications  No. Follow up visit  No.  Do you have questions or concerns about your Care? No.  Actions: * If pain score is 4 or above: No action needed, pain <4.

## 2016-07-07 ENCOUNTER — Encounter: Payer: Self-pay | Admitting: Internal Medicine

## 2016-07-21 ENCOUNTER — Encounter: Payer: Self-pay | Admitting: Family Medicine

## 2016-07-21 ENCOUNTER — Ambulatory Visit (INDEPENDENT_AMBULATORY_CARE_PROVIDER_SITE_OTHER): Payer: BC Managed Care – PPO | Admitting: Family Medicine

## 2016-07-21 VITALS — BP 120/66 | HR 96 | Temp 99.9°F | Resp 16 | Ht 62.0 in | Wt 106.4 lb

## 2016-07-21 DIAGNOSIS — J324 Chronic pansinusitis: Secondary | ICD-10-CM

## 2016-07-21 DIAGNOSIS — R52 Pain, unspecified: Secondary | ICD-10-CM | POA: Diagnosis not present

## 2016-07-21 LAB — POCT INFLUENZA A/B
Influenza A, POC: NEGATIVE
Influenza B, POC: NEGATIVE

## 2016-07-21 MED ORDER — AMOXICILLIN-POT CLAVULANATE 875-125 MG PO TABS: 1.0000 | ORAL_TABLET | Freq: Two times a day (BID) | ORAL | 0 refills | Status: DC

## 2016-07-21 MED ORDER — PROMETHAZINE-DM 6.25-15 MG/5ML PO SYRP: 5.0000 mL | ORAL_SOLUTION | Freq: Four times a day (QID) | ORAL | 0 refills | Status: DC | PRN

## 2016-07-21 MED ORDER — FLUTICASONE PROPIONATE 50 MCG/ACT NA SUSP: 2.0000 | Freq: Every day | NASAL | 6 refills | Status: DC

## 2016-07-21 NOTE — Patient Instructions (Signed)

## 2016-07-21 NOTE — Progress Notes (Signed)
Patient ID: Emily Kirby, female   DOB: 02-09-1966, 51 y.o.   MRN: [id number]    Subjective:  I acted as a Education administrator for Dr. Carollee Herter.  Guerry Bruin, [place]   Patient ID: Emily Kirby, female    DOB: 1965-07-28, 51 y.o.   MRN: [id number]  Chief Complaint  Patient presents with  . Cough  . chest congestion  . Headache  . Generalized Body Aches    Cough  This is a new problem. Episode onset: Friday morning. The cough is non-productive. Associated symptoms include chills, a fever, headaches and nasal congestion. Pertinent negatives include no chest pain, ear pain, eye redness, rash, sore throat, shortness of breath or wheezing.  Headache   Associated symptoms include coughing and a fever. Pertinent negatives include no ear pain, eye pain, eye redness, nausea, sore throat or vomiting.    Patient is in today for for cough, headache, body aches, and chest congestion.  Symptoms started on Friday.  She has a dry cough.  She has taken sudafed pe with no relief.  Patient Care Team: Carollee Herter, Alferd Apa, DO as PCP - General (Family Medicine) Molli Posey, MD as Consulting Physician (Obstetrics and Gynecology) Rutherford Guys, MD as Consulting Physician (Ophthalmology)   Past Medical History:  Diagnosis Date  . AC (acromioclavicular) joint bone spurs   . Allergy    SEASONAL  . Chicken pox   . Kidney stones   . Uterine fibroid     Past Surgical History:  Procedure Laterality Date  . CESAREAN SECTION    . DILATION AND CURETTAGE OF UTERUS    . LIPOSUCTION      Family History  Problem Relation Age of Onset  . Cancer Father        prostate  . Hearing loss Father   . COPD Father   . Heart disease Father        MI  . Prostate cancer Father   . Hypertension Mother   . Colon cancer Neg Hx     Social History   Social History  . Marital status: Married    Spouse name: N/A  . Number of children: N/A  . Years of education: N/A   Occupational History  .  [employer]    court interpreter   Social History Main Topics  . Smoking status: Never Smoker  . Smokeless tobacco: Never Used  . Alcohol use 0.0 oz/week     Comment:  3 X PER WEEK, HAVE NOT HAD ANY IN THE LAST 1 1/2  . Drug use: No  . Sexual activity: Yes    Partners: Male   Other Topics Concern  . Not on file   Social History Narrative  . No narrative on file    Outpatient Medications Prior to Visit  Medication Sig Dispense Refill  . buPROPion (WELLBUTRIN XL) 300 MG 24 hr tablet Take 1 tablet (300 mg total) by mouth daily. 90 tablet 3   Facility-Administered Medications Prior to Visit  Medication Dose Route Frequency Provider Last Rate Last Dose  . 0.9 %  sodium chloride infusion  500 mL Intravenous Continuous Irene Shipper, MD        Allergies  Allergen Reactions  . Codeine Other (See Comments)    Stomach upset  . Pseudoephedrine Other (See Comments)    Tingling all over body    Review of Systems  Constitutional: Positive for chills, fever and malaise/fatigue.       Body aches all  over   HENT: Positive for congestion. Negative for ear discharge, ear pain and sore throat.        Chest congestion   Eyes: Negative for pain, discharge and redness.  Respiratory: Positive for cough. Negative for sputum production, shortness of breath and wheezing.   Cardiovascular: Negative for chest pain and palpitations.  Gastrointestinal: Negative for diarrhea, nausea and vomiting.  Skin: Negative for rash.  Neurological: Positive for headaches.       Objective:    Physical Exam  Constitutional: She is oriented to person, place, and time. She appears well-developed and well-nourished.  HENT:  Right Ear: External ear normal.  Left Ear: External ear normal.  Nose: Rhinorrhea present. Right sinus exhibits maxillary sinus tenderness and frontal sinus tenderness. Left sinus exhibits maxillary sinus tenderness and frontal sinus tenderness.  Mouth/Throat: Posterior oropharyngeal  erythema present.  + PND + errythema  Eyes: Conjunctivae are normal. Right eye exhibits no discharge. Left eye exhibits no discharge.  Cardiovascular: Normal rate, regular rhythm and normal heart sounds.   No murmur heard. Pulmonary/Chest: [facility] normal and breath sounds normal. No respiratory distress. She has no wheezes. She has no rales. She exhibits no tenderness.  Musculoskeletal: She exhibits no edema.  Lymphadenopathy:    She has cervical adenopathy.  Neurological: She is alert and oriented to person, place, and time.  Nursing note and vitals reviewed.   BP 120/66 (BP Location: Left Arm, Cuff Size: Normal)   Pulse 96   Temp 99.9 F (37.7 C) (Oral)   Resp 16   Ht 5\' 2"  (1.575 m)   Wt 106 lb 6.4 oz (48.3 kg)   LMP 07/19/2016   SpO2 98%   BMI 19.46 kg/m  Wt Readings from Last 3 Encounters:  07/21/16 106 lb 6.4 oz (48.3 kg)  06/29/16 108 lb (49 kg)  06/15/16 108 lb (49 kg)   BP Readings from Last 3 Encounters:  07/21/16 120/66  06/29/16 109/69  11/12/15 108/60     Immunization History  Administered Date(s) Administered  . Tdap 10/09/2013    Health Maintenance  Topic Date Due  . HIV Screening  11/11/2016 (Originally 09/11/1980)  . INFLUENZA VACCINE  10/07/2016  . MAMMOGRAM  10/27/2016  . PAP SMEAR  10/09/2018  . TETANUS/TDAP  10/10/2023  . COLONOSCOPY  06/30/2026    Lab Results  Component Value Date   WBC 5.7 11/12/2015   HGB 14.0 11/12/2015   HCT 41.6 11/12/2015   PLT 273.0 11/12/2015   GLUCOSE 100 (H) 11/12/2015   CHOL 161 11/12/2015   TRIG 66.0 11/12/2015   HDL 61.00 11/12/2015   LDLCALC 87 11/12/2015   ALT 19 11/12/2015   AST 20 11/12/2015   NA 138 11/12/2015   K 3.9 11/12/2015   CL 102 11/12/2015   CREATININE 0.68 11/12/2015   BUN 14 11/12/2015   CO2 32 11/12/2015   TSH 1.93 11/12/2015    Lab Results  Component Value Date   TSH 1.93 11/12/2015   Lab Results  Component Value Date   WBC 5.7 11/12/2015   HGB 14.0 11/12/2015   HCT 41.6  11/12/2015   MCV 83.5 11/12/2015   PLT 273.0 11/12/2015   Lab Results  Component Value Date   NA 138 11/12/2015   K 3.9 11/12/2015   CO2 32 11/12/2015   GLUCOSE 100 (H) 11/12/2015   BUN 14 11/12/2015   CREATININE 0.68 11/12/2015   BILITOT 0.3 11/12/2015   ALKPHOS 47 11/12/2015   AST 20 11/12/2015   ALT  19 11/12/2015   PROT 7.3 11/12/2015   ALBUMIN 4.6 11/12/2015   CALCIUM 9.2 11/12/2015   GFR 97.28 11/12/2015   Lab Results  Component Value Date   CHOL 161 11/12/2015   Lab Results  Component Value Date   HDL 61.00 11/12/2015   Lab Results  Component Value Date   LDLCALC 87 11/12/2015   Lab Results  Component Value Date   TRIG 66.0 11/12/2015   Lab Results  Component Value Date   CHOLHDL 3 11/12/2015   No results found for: HGBA1C       Assessment & Plan:   Problem List Items Addressed This Visit    None    Visit Diagnoses    Pansinusitis, unspecified chronicity    -  Primary   Relevant Medications   fluticasone (FLONASE) 50 MCG/ACT nasal spray   amoxicillin-clavulanate (AUGMENTIN) 875-125 MG tablet   promethazine-dextromethorphan (PROMETHAZINE-DM) 6.25-15 MG/5ML syrup   Body aches       Relevant Orders   POCT Influenza A/B (Completed)      I am having Ms. Dimaria start on fluticasone, amoxicillin-clavulanate, and promethazine-dextromethorphan. I am also having her maintain her buPROPion. We will continue to administer sodium chloride.  Meds ordered this encounter  Medications  . fluticasone (FLONASE) 50 MCG/ACT nasal spray    Sig: Place 2 sprays into both nostrils daily.    Dispense:  16 g    Refill:  6  . amoxicillin-clavulanate (AUGMENTIN) 875-125 MG tablet    Sig: Take 1 tablet by mouth 2 (two) times daily.    Dispense:  20 tablet    Refill:  0  . promethazine-dextromethorphan (PROMETHAZINE-DM) 6.25-15 MG/5ML syrup    Sig: Take 5 mLs by mouth 4 (four) times daily as needed.    Dispense:  118 mL    Refill:  0    CMA served as scribe  during this visit. History, Physical and Plan performed by medical provider. Documentation and orders reviewed and attested to.  [name], DO

## 2016-07-28 ENCOUNTER — Other Ambulatory Visit: Payer: Self-pay | Admitting: Family Medicine

## 2016-07-28 ENCOUNTER — Telehealth: Payer: Self-pay | Admitting: Family Medicine

## 2016-07-28 DIAGNOSIS — J324 Chronic pansinusitis: Secondary | ICD-10-CM

## 2016-07-28 MED ORDER — LEVOFLOXACIN 500 MG PO TABS: 500.0000 mg | ORAL_TABLET | Freq: Every day | ORAL | 0 refills | Status: DC

## 2016-07-28 MED ORDER — PREDNISONE 10 MG PO TABS: ORAL_TABLET | ORAL | 0 refills | Status: DC

## 2016-07-28 NOTE — Telephone Encounter (Signed)
Sent in antibiotic Called the patient left detailed message prescriptions sent in.

## 2016-07-28 NOTE — Telephone Encounter (Signed)
Symptoms patient is having, cough, mucous---still taking antibiotic, cough syrup and nasal spray. Stopped the sudafed on Sunday was the last day, still having head pressure/like in a bubble---should she restart the sudafed or something else. OK TO LEAVE A DETAILED MESSAGE WHEN CALL BACK WITH RESPONSE

## 2016-07-28 NOTE — Telephone Encounter (Signed)
pred taper sent Change abx levaquin 500 mg qd x 7 days

## 2016-07-28 NOTE — Telephone Encounter (Signed)
Relation to JH:HIDU Call back number:[phone]  Reason for call:  Patient was last seen 07/21/16 and states she finished sudafed pe Sunday and she was experiencing dizziness on Sunday, patient in need of clinical advice stating sinus pressure has not improved,please advise

## 2016-08-05 ENCOUNTER — Encounter: Payer: Self-pay | Admitting: Family Medicine

## 2016-08-07 NOTE — Telephone Encounter (Signed)
Patient called and stated she still has a cough. Offered appt with PCP, but she wanted to wait out the weekend and if no better on Monday will schedule appt then.

## 2016-11-13 ENCOUNTER — Ambulatory Visit (INDEPENDENT_AMBULATORY_CARE_PROVIDER_SITE_OTHER): Payer: BC Managed Care – PPO | Admitting: Family Medicine

## 2016-11-13 ENCOUNTER — Encounter: Payer: Self-pay | Admitting: Family Medicine

## 2016-11-13 VITALS — BP 104/64 | HR 92 | Temp 98.3°F | Ht 61.5 in | Wt 105.2 lb

## 2016-11-13 DIAGNOSIS — Z0000 Encounter for general adult medical examination without abnormal findings: Secondary | ICD-10-CM

## 2016-11-13 LAB — CBC WITH DIFFERENTIAL/PLATELET
BASOS ABS: 0 10*3/uL (ref 0.0–0.1)
BASOS PCT: 0.7 % (ref 0.0–3.0)
EOS PCT: 1.7 % (ref 0.0–5.0)
Eosinophils Absolute: 0.1 10*3/uL (ref 0.0–0.7)
HEMATOCRIT: 42 % (ref 36.0–46.0)
HEMOGLOBIN: 13.5 g/dL (ref 12.0–15.0)
LYMPHS ABS: 1.1 10*3/uL (ref 0.7–4.0)
Lymphocytes Relative: 19.3 % (ref 12.0–46.0)
MCHC: 32.1 g/dL (ref 30.0–36.0)
MCV: 86.4 fl (ref 78.0–100.0)
MONO ABS: 0.3 10*3/uL (ref 0.1–1.0)
Monocytes Relative: 4.5 % (ref 3.0–12.0)
NEUTROS ABS: 4.2 10*3/uL (ref 1.4–7.7)
Neutrophils Relative %: 73.8 % (ref 43.0–77.0)
Platelets: 259 10*3/uL (ref 150.0–400.0)
RBC: 4.86 Mil/uL (ref 3.87–5.11)
RDW: 13.8 % (ref 11.5–15.5)
WBC: 5.7 10*3/uL (ref 4.0–10.5)

## 2016-11-13 LAB — POC URINALSYSI DIPSTICK (AUTOMATED)
Bilirubin, UA: NEGATIVE
Glucose, UA: NEGATIVE
Ketones, UA: NEGATIVE
Leukocytes, UA: NEGATIVE
Nitrite, UA: NEGATIVE
Protein, UA: NEGATIVE
RBC UA: NEGATIVE
SPEC GRAV UA: 1.025 (ref 1.010–1.025)
UROBILINOGEN UA: 0.2 U/dL
pH, UA: 6 (ref 5.0–8.0)

## 2016-11-13 LAB — LIPID PANEL
CHOL/HDL RATIO: 3
Cholesterol: 158 mg/dL (ref 0–200)
HDL: 56.1 mg/dL (ref >39.00)
LDL CALC: 90 mg/dL (ref 0–99)
NonHDL: 101.81
TRIGLYCERIDES: 58 mg/dL (ref 0.0–149.0)
VLDL: 11.6 mg/dL (ref 0.0–40.0)

## 2016-11-13 LAB — COMPREHENSIVE METABOLIC PANEL
ALBUMIN: 4.4 g/dL (ref 3.5–5.2)
ALK PHOS: 45 U/L (ref 39–117)
ALT: 18 U/L (ref 0–35)
AST: 18 U/L (ref 0–37)
BUN: 13 mg/dL (ref 6–23)
CALCIUM: 9.6 mg/dL (ref 8.4–10.5)
CHLORIDE: 104 meq/L (ref 96–112)
CO2: 31 mEq/L (ref 19–32)
CREATININE: 0.75 mg/dL (ref 0.40–1.20)
GFR: 86.53 mL/min (ref >60.00)
Glucose, Bld: 98 mg/dL (ref 70–99)
POTASSIUM: 3.9 meq/L (ref 3.5–5.1)
Sodium: 141 mEq/L (ref 135–145)
TOTAL PROTEIN: 6.8 g/dL (ref 6.0–8.3)
Total Bilirubin: 0.3 mg/dL (ref 0.2–1.2)

## 2016-11-13 LAB — TSH: TSH: 1.65 u[IU]/mL (ref 0.35–4.50)

## 2016-11-13 NOTE — Progress Notes (Signed)
Subjective:     Emily Kirby is a 51 y.o. female and is here for a comprehensive physical exam. The patient reports bumps on fingers R hand--- they do not hurt her.  No injury.    Social History   Social History  . Marital status: Married    Spouse name: N/A  . Number of children: N/A  . Years of education: N/A   Occupational History  .  [employer]    court interpreter   Social History Main Topics  . Smoking status: Never Smoker  . Smokeless tobacco: Never Used  . Alcohol use 0.0 oz/week     Comment:  3 X PER WEEK, HAVE NOT HAD ANY IN THE LAST 1 1/2  . Drug use: No  . Sexual activity: Yes    Partners: Male   Other Topics Concern  . Not on file   Social History Narrative  . No narrative on file   Health Maintenance  Topic Date Due  . HIV Screening  09/11/1980  . MAMMOGRAM  10/27/2016  . INFLUENZA VACCINE  06/06/2017 (Originally 10/07/2016)  . PAP SMEAR  10/09/2018  . TETANUS/TDAP  10/10/2023  . COLONOSCOPY  06/30/2026    The following portions of the patient's history were reviewed and updated as appropriate:  She  has a past medical history of AC (acromioclavicular) joint bone spurs; Allergy; Chicken pox; Kidney stones; and Uterine fibroid. She  does not have any pertinent problems on file. She  has a past surgical history that includes Liposuction; Cesarean section; and Dilation and curettage of uterus. Her family history includes COPD in her father; Cancer in her father; Hearing loss in her father; Heart disease in her father; Hypertension in her mother; Prostate cancer in her father. She  reports that she has never smoked. She has never used smokeless tobacco. She reports that she drinks alcohol. She reports that she does not use drugs. She has a current medication list which includes the following prescription(s): bupropion, fluticasone, and promethazine-dextromethorphan, and the following Facility-Administered Medications: sodium  chloride. Current Outpatient Prescriptions on File Prior to Visit  Medication Sig Dispense Refill  . buPROPion (WELLBUTRIN XL) 300 MG 24 hr tablet Take 1 tablet (300 mg total) by mouth daily. 90 tablet 3  . fluticasone (FLONASE) 50 MCG/ACT nasal spray Place 2 sprays into both nostrils daily. (Patient not taking: Reported on 11/13/2016) 16 g 6  . promethazine-dextromethorphan (PROMETHAZINE-DM) 6.25-15 MG/5ML syrup Take 5 mLs by mouth 4 (four) times daily as needed. (Patient not taking: Reported on 11/13/2016) 118 mL 0   Current Facility-Administered Medications on File Prior to Visit  Medication Dose Route Frequency Provider Last Rate Last Dose  . 0.9 %  sodium chloride infusion  500 mL Intravenous Continuous Irene Shipper, MD       She is allergic to codeine and pseudoephedrine..  Review of Systems Review of Systems  Constitutional: Negative for activity change, appetite change and fatigue.  HENT: Negative for hearing loss, congestion, tinnitus and ear discharge.  dentist q51m Eyes: Negative for visual disturbance (see optho q1y -- vision corrected to 20/20 with glasses).  Respiratory: Negative for cough, chest tightness and shortness of breath.   Cardiovascular: Negative for chest pain, palpitations and leg swelling.  Gastrointestinal: Negative for abdominal pain, diarrhea, constipation and abdominal distention.  Genitourinary: Negative for urgency, frequency, decreased urine volume and difficulty urinating.  Musculoskeletal: Negative for back pain, arthralgias and gait problem.  Skin: Negative for color change, pallor  and rash.  Neurological: Negative for dizziness, light-headedness, numbness and headaches.  Hematological: Negative for adenopathy. Does not bruise/bleed easily.  Psychiatric/Behavioral: Negative for suicidal ideas, confusion, sleep disturbance, self-injury, dysphoric mood, decreased concentration and agitation.       Objective:    BP 104/64 (BP Location: Right Arm,  Patient Position: Sitting, Cuff Size: Normal)   Pulse 92   Temp 98.3 F (36.8 C) (Oral)   Ht 5' 1.5" (1.562 m)   Wt 105 lb 3.2 oz (47.7 kg)   LMP 11/03/2016   SpO2 97%   BMI 19.56 kg/m  General appearance: alert, cooperative, appears stated age and no distress Head: Normocephalic, without obvious abnormality, atraumatic Eyes: conjunctivae/corneas clear. PERRL, EOM's intact. Fundi benign. Ears: normal TM's and external ear canals both ears Nose: Nares normal. Septum midline. Mucosa normal. No drainage or sinus tenderness. Throat: lips, mucosa, and tongue normal; teeth and gums normal Neck: no adenopathy, no carotid bruit, no JVD, supple, symmetrical, trachea midline and thyroid not enlarged, symmetric, no tenderness/mass/nodules Back: symmetric, no curvature. ROM normal. No CVA tenderness. Lungs: clear to auscultation bilaterally Breasts: gyn Heart: regular rate and rhythm, S1, S2 normal, no murmur, click, rub or gallop Abdomen: soft, non-tender; bowel sounds normal; no masses,  no organomegaly Pelvic: deferred--gyn Extremities: extremities normal, atraumatic, no cyanosis or edema---- + hard nodule on index finger r hand-- non tender, small Pulses: 2+ and symmetric Skin: Skin color, texture, turgor normal. No rashes or lesions Lymph nodes: Cervical, supraclavicular, and axillary nodes normal. Neurologic: Alert and oriented X 3, normal strength and tone. Normal symmetric reflexes. Normal coordination and gait    Assessment:    Healthy female exam.      Plan:    ghm utd Check labs See After Visit Summary for Counseling Recommendations    1. Preventative health care See above - CBC with Differential/Platelet - Lipid panel - TSH - Comprehensive metabolic panel - POCT Urinalysis Dipstick (Automated)

## 2016-11-13 NOTE — Patient Instructions (Signed)
Preventive Care 40-64 Years, Female Preventive care refers to lifestyle choices and visits with your health care provider that can promote health and wellness. What does preventive care include?  A yearly physical exam. This is also called an annual well check.  Dental exams once or twice a year.  Routine eye exams. Ask your health care provider how often you should have your eyes checked.  Personal lifestyle choices, including: ? Daily care of your teeth and gums. ? Regular physical activity. ? Eating a healthy diet. ? Avoiding tobacco and drug use. ? Limiting alcohol use. ? Practicing safe sex. ? Taking low-dose aspirin daily starting at age 51. ? Taking vitamin and mineral supplements as recommended by your health care provider. What happens during an annual well check? The services and screenings done by your health care provider during your annual well check will depend on your age, overall health, lifestyle risk factors, and family history of disease. Counseling Your health care provider may ask you questions about your:  Alcohol use.  Tobacco use.  Drug use.  Emotional well-being.  Home and relationship well-being.  Sexual activity.  Eating habits.  Work and work Statistician.  Method of birth control.  Menstrual cycle.  Pregnancy history.  Screening You may have the following tests or measurements:  Height, weight, and BMI.  Blood pressure.  Lipid and cholesterol levels. These may be checked every 5 years, or more frequently if you are over 51 years old.  Skin check.  Lung cancer screening. You may have this screening every year starting at age 51 if you have a 30-pack-year history of smoking and currently smoke or have quit within the past 15 years.  Fecal occult blood test (FOBT) of the stool. You may have this test every year starting at age 51.  Flexible sigmoidoscopy or colonoscopy. You may have a sigmoidoscopy every 5 years or a colonoscopy  every 10 years starting at age 30.  Hepatitis C blood test.  Hepatitis B blood test.  Sexually transmitted disease (STD) testing.  Diabetes screening. This is done by checking your blood sugar (glucose) after you have not eaten for a while (fasting). You may have this done every 1-3 years.  Mammogram. This may be done every 1-2 years. Talk to your health care provider about when you should start having regular mammograms. This may depend on whether you have a family history of breast cancer.  BRCA-related cancer screening. This may be done if you have a family history of breast, ovarian, tubal, or peritoneal cancers.  Pelvic exam and Pap test. This may be done every 3 years starting at age 51. Starting at age 36, this may be done every 5 years if you have a Pap test in combination with an HPV test.  Bone density scan. This is done to screen for osteoporosis. You may have this scan if you are at high risk for osteoporosis.  Discuss your test results, treatment options, and if necessary, the need for more tests with your health care provider. Vaccines Your health care provider may recommend certain vaccines, such as:  Influenza vaccine. This is recommended every year.  Tetanus, diphtheria, and acellular pertussis (Tdap, Td) vaccine. You may need a Td booster every 10 years.  Varicella vaccine. You may need this if you have not been vaccinated.  Zoster vaccine. You may need this after age 5.  Measles, mumps, and rubella (MMR) vaccine. You may need at least one dose of MMR if you were born in  1957 or later. You may also need a second dose.  Pneumococcal 13-valent conjugate (PCV13) vaccine. You may need this if you have certain conditions and were not previously vaccinated.  Pneumococcal polysaccharide (PPSV23) vaccine. You may need one or two doses if you smoke cigarettes or if you have certain conditions.  Meningococcal vaccine. You may need this if you have certain  conditions.  Hepatitis A vaccine. You may need this if you have certain conditions or if you travel or work in places where you may be exposed to hepatitis A.  Hepatitis B vaccine. You may need this if you have certain conditions or if you travel or work in places where you may be exposed to hepatitis B.  Haemophilus influenzae type b (Hib) vaccine. You may need this if you have certain conditions.  Talk to your health care provider about which screenings and vaccines you need and how often you need them. This information is not intended to replace advice given to you by your health care provider. Make sure you discuss any questions you have with your health care provider. Document Released: 03/22/2015 Document Revised: 11/13/2015 Document Reviewed: 12/25/2014 Elsevier Interactive Patient Education  2017 [employer].

## 2016-11-19 ENCOUNTER — Telehealth: Payer: Self-pay | Admitting: Family Medicine

## 2016-11-19 MED ORDER — CYCLOBENZAPRINE HCL 10 MG PO TABS: 10.0000 mg | ORAL_TABLET | Freq: Three times a day (TID) | ORAL | 0 refills | Status: DC | PRN

## 2016-11-19 NOTE — Telephone Encounter (Signed)
Spoke with pt and made her aware of Dr. Nonda Lou message. Pt still wanted the rx and it was sent to her pharmacy. Nothing further is needed

## 2016-11-19 NOTE — Telephone Encounter (Signed)
Pt traveling next week. Pt has intermitten muscle pain from trapez and tennis elbow per pt. She says she wants some on hand while gone. Pt states she went to specialist with [facility] and they sent her for phys therapy. Pt says that doc is out on maternity. Pt wants to know if Carollee Herter can prescribe muscle relaxant to [facility] on [address]. verfd ph # on acct to call pt.

## 2016-11-19 NOTE — Telephone Encounter (Signed)
Flexeril 10 mg #30  1 po tid prn

## 2016-12-26 ENCOUNTER — Other Ambulatory Visit: Payer: Self-pay | Admitting: Family Medicine

## 2016-12-26 DIAGNOSIS — F329 Major depressive disorder, single episode, unspecified: Secondary | ICD-10-CM

## 2016-12-26 DIAGNOSIS — F32A Depression, unspecified: Secondary | ICD-10-CM

## 2017-02-01 ENCOUNTER — Encounter: Payer: Self-pay | Admitting: Family Medicine

## 2017-02-01 NOTE — Telephone Encounter (Signed)
Pt could come here so we can see it and maybe check labs  We also may be able to give her meds to help and refer if needed

## 2017-04-05 ENCOUNTER — Encounter: Payer: Self-pay | Admitting: Family Medicine

## 2017-04-05 ENCOUNTER — Ambulatory Visit: Payer: BC Managed Care – PPO | Admitting: Family Medicine

## 2017-04-05 VITALS — BP 112/80 | HR 88 | Temp 98.7°F | Ht 62.0 in | Wt 109.0 lb

## 2017-04-05 DIAGNOSIS — M778 Other enthesopathies, not elsewhere classified: Secondary | ICD-10-CM

## 2017-04-05 DIAGNOSIS — M79644 Pain in right finger(s): Secondary | ICD-10-CM

## 2017-04-05 DIAGNOSIS — M79645 Pain in left finger(s): Secondary | ICD-10-CM | POA: Diagnosis not present

## 2017-04-05 DIAGNOSIS — M7711 Lateral epicondylitis, right elbow: Secondary | ICD-10-CM

## 2017-04-05 MED ORDER — DICLOFENAC SODIUM 2 % TD SOLN: TRANSDERMAL | 1 refills | Status: DC

## 2017-04-05 NOTE — Patient Instructions (Addendum)
Consider getting a forearm strap for tennis elbow. Band-It is an option.   Ice/cold pack over area for 10-15 min every 2-3 hours while awake.  OK to take Tylenol 1000 mg (2 extra strength tabs) or 975 mg (3 regular strength tabs) every 6 hours as needed.  Wear the brace at night.  Someone will call you in the next day or so regarding your topical medicine. This is normal. They will then send the medicine to you via mail.   Elbow and Forearm Exercises It is normal to feel mild stretching, pulling, tightness, or discomfort as you do these exercises, but you should stop right away if you feel sudden pain or your pain gets worse. RANGE OF MOTION EXERCISES These exercises warm up your muscles and joints and improve the movement and flexibility of your injured elbow and forearm. These exercises also help to relieve pain, numbness, and tingling.These exercises are done using the muscles in your injured elbow and forearm. Exercise A: Elbow Flexion, Active 1. Hold your left / right arm at your side, and bend your elbow as far as you can using your left / right arm muscles. 2. Hold this position for 30 seconds. 3. Slowly return to the starting position. Repeat 2 times. Complete this exercise 3 times per week. Exercise B: Elbow Extension, Active 1. Hold your left / right arm at your side, and straighten your elbow as much as you can using your left / right arm muscles. 2. Hold this position for 30 seconds. 3. Slowly return to the starting position. Repeat 2 times. Complete this exercise 3 times per week. Exercise C: Forearm Rotation, Supination, Active 1. Stand or sit with your elbows at your sides. 2. Bend your left / right elbow to an "L" shape (90 degrees). 3. Turn your palm upward until you feel a gentle stretch on the inside of your forearm. 4. Hold this position for 30 seconds. 5. Slowly release and return to the starting position. Repeat 2 times. Complete this exercise 3 times per  week. Exercise D: Forearm Rotation, Pronation, Active 1. Stand or sit with your elbows at your side. 2. Bend your left / right elbow to an "L" shape (90 degrees). 3. Turn your left / right palm downward until you feel a gentle stretch on the top of your forearm. 4. Hold this position for 30 seconds. 5. Slowly release and return to the starting position. Repeat2 times. Complete this exercise 3 times per week. STRETCHING EXERCISES These exercises warm up your muscles and joints and improve the movement and flexibility of your injured elbow and forearm. These exercises also help to relieve pain, numbness, and tingling.These exercises are done using your healthy elbow and forearm to help stretch the muscles in your injured elbow and forearm. Exercise E: Elbow Flexion, Active-Assisted  1. Hold your left / right arm at your side, and bend your elbow as much as you can using your left / right arm muscles. 2. Use your other hand to bend your left / right elbow farther. To do this, gently push up on your forearm until you feel a gentle stretch on the back of your elbow. 3. Hold this position for 30 seconds. 4. Slowly return to the starting position. Repeat 2 times. Complete this exercise 3 times per week. Exercise F: Elbow Extension, Active-Assisted  1. Hold your left / right arm at your side, and straighten your elbow as much as you can using your left / right arm muscles. 2. Use  your other hand to straighten the left / right elbow farther. To do this, gently push down on your forearm until you feel a gentle stretch on the inside of your elbow. 3. Hold this position for 30 seconds. 4. Slowly return to the starting position. Repeat 2 times. Complete this exercise 3 times per weeky. Exercise G: Forearm Rotation, Supination, Active-Assisted  1. Sit with your left / right elbow bent in an "L" shape (90 degrees) with your forearm resting on a table. 2. Keeping your upper body and shoulder still,  rotate your forearm so your left / right palm faces upward. 3. Use your other hand to help rotate your forearm further until you feel a gentle to moderate stretch. 4. Hold this position for 30 seconds. 5. Slowly release the stretch and return to the starting position. Repeat 2 times. Complete this exercise 3 times per week. Exercise H: Forearm Rotation, Pronation, Active-Assisted  1. Sit with your left / right elbow bent in an "L" shape (90 degrees) with your forearm resting on a table. 2. Keeping your upper body and shoulder still, rotate your forearm so your palm faces the tabletop. 3. Use your other hand to help rotate your forearm further until you feel a gentle to moderate stretch. 4. Hold this position for 30 seconds. 5. Slowly release the stretch and return to the starting position. Repeat 2 times. Complete this exercise 3 times per week. Exercise I: Elbow Flexion, Supine, Passive 1. Lie on your back. 2. Extend your left / right arm up in the air, bracing it with your other hand. 3. Let your left / right your hand slowly lower toward your shoulder, while your elbow stays pointed toward the ceiling. You should feel a gentle stretch along the back of your upper arm and elbow. 4. If instructed by your health care provider, you may increase the intensity of your stretch by adding a small wrist weight or hand weight. 5. Hold this position for 3 seconds. 6. Slowly return to the starting position. Repeat 2 times. Complete this exercise 3 times per week. Exercise J: Elbow Extension, Supine, Passive  1. Lie on your back. Make sure that you are in a comfortable position that lets you relax your arm muscles. 2. Place a folded towel under your left / right upper arm so your elbow and shoulder are at the same height. Straighten your left / right arm so your elbow does not rest on the bed or towel. 3. Let the weight of your hand stretch your elbow. Keep your arm and chest muscles relaxed. You  should feel a stretch on the inside of your elbow. 4. If told by your health care provider, you may increase the intensity of your stretch by adding a small wrist weight or hand weight. 5. Hold this position for 30 seconds. 6. Slowly release the stretch. Repeat 2 times. Complete this exercise 3 times per week. STRENGTHENING EXERCISES These exercises build strength and endurance in your elbow and forearm. Endurance is the ability to use your muscles for a long time, even after they get tired. Exercise K: Elbow Flexion, Isometric  1. Stand or sit up straight. 2. Bend your left / right elbow in an "L" shape (90 degrees) and turn your palm up so your forearm is at the height of your waist. 3. Place your other hand on top of your forearm. Gently push down as your left / right arm resists. Push as hard as you can with both arms  without causing any pain or movement at your left / right elbow. 4. Hold this position for 3 seconds. 5. Slowly release the tension in both arms. Let your muscles relax completely before repeating. Repeat 2 times. Complete this exercise 3 times per week. Exercise L: Elbow Extensors, Isometric  1. Stand or sit up straight. 2. Place your left / right arm so your palm faces your abdomen and it is at the height of your waist. 3. Place your other hand on the underside of your forearm. Gently push up as your left / right arm resists. Push as hard as you can with both arms, without causing any pain or movement at your left / right elbow. 4. Hold this position for 3 seconds. 5. Slowly release the tension in both arms. Let your muscles relax completely before repeating. Repeat _______2___ times. Complete this exercise 3 times per week. Exercise M: Elbow Flexion With Forearm Palm Up  1. Sit upright on a firm chair without armrests, or stand. 2. Place your left / right arm at your side with your palm facing forward. 3. Holding a 5 lbweight or gripping a rubber exercise band or  tubing, bend your elbow to bring your hand toward your shoulder. 4. Hold this position for 3 seconds. 5. Slowly return to the starting position. Repeat 2 times. Complete this exercise 3 times per week. Exercise N: Elbow Extension  1. Sit on a firm chair without armrests, or stand. 2. Keeping your upper arms at your sides, bring both hands up toward your left / right shoulder while you grip a rubber exercise band or tubing. Your left / right hand should be just below the other hand. 3. Straighten your left / right elbow. 4. Hold this position for 3 seconds. 5. Control the resistance of the band or tubing as your hand returns to your side. Repeat 2 times. Complete this exercise 3 times per week. Exercise O: Forearm Rotation, Supination  1. Sit with your left / right forearm supported on a table. Keep your elbow at waist height. 2. Rest your hand over the edge of the table with your palm facing down. 3. Gently hold a lightweight hammer. 4. Without moving your elbow, slowly rotate your forearm to turn your palm and hand upward to a "thumbs-up" position. 5. Hold this position for 3 seconds. 6. Slowly return to the starting position. Repeat 2 times. Complete this exercise 3 times per week. Exercise P: Forearm Rotation, Pronation  1. Sit with your left / right forearm supported on a table. Keep your elbow below shoulder height. 2. Rest your hand over the edge of the table with your palm facing up. 3. Gently hold a lightweight hammer. 4. Without moving your elbow, slowly rotate your forearm to turn your palm and hand upward to a "thumbs-up" position. 5. Hold this position for 3 seconds. 6. Slowly return to the starting position. Repeat 2 times. Complete this exercise 3 times per week.  Make sure you discuss any questions you have with your health care provider. Document Released: 01/07/2005 Document Revised: 07/04/2015 Document Reviewed: 11/18/2014 Elsevier Interactive Patient Education   [employer].

## 2017-04-05 NOTE — Progress Notes (Signed)
Musculoskeletal Exam  Patient: Emily Kirby DOB: 1965/03/20  DOS: 04/05/2017  SUBJECTIVE:  Chief Complaint:   Chief Complaint  Patient presents with  . Hand Pain    Emily Kirby is a 52 y.o.  female for evaluation and treatment of finger, elbow and wrist pain.   Onset:  2 months ago.  Was trying to open something.  Location: DIP of 2nd digit b/l, worse on R, lateral epicondyle, lateral wrist Character:  aching  Progression of issue:  is unchanged Associated symptoms: swelling and bruising over hand Treatment: to date has been OTC NSAIDS and Glucosamine (that was helpful).   Neurovascular symptoms: no She does play tennis  ROS: Musculoskeletal/Extremities: +R finger, elbow and wrist pain Neuro: No numbness, tingling  Past Medical History:  Diagnosis Date  . AC (acromioclavicular) joint bone spurs   . Allergy    SEASONAL  . Chicken pox   . Kidney stones   . Uterine fibroid     Objective: VITAL SIGNS: BP 112/80 (BP Location: Left Arm, Patient Position: Sitting, Cuff Size: Normal)   Pulse 88   Temp 98.7 F (37.1 C) (Oral)   Ht 5\' 2"  (1.575 m)   Wt 109 lb (49.4 kg)   SpO2 98%   BMI 19.94 kg/m  Constitutional: Well formed, well developed. No acute distress. Cardiovascular: Brisk cap refill Thorax & Lungs: No accessory muscle use Musculoskeletal: Fingers.   Normal active range of motion: yes.   Normal passive range of motion: yes Tenderness to palpation: mild ttp over 2nd digits, DIP's Deformity: herberden's nodes noted dorsally b/l 2nd digits Ecchymosis: no Tests negative: Tinel's, Finkelstein's R elbow: +TTP over lat epi, +TTP w resisted wrist extension R wrist: +TTP over thumb extensor, no snuff box ttp Neurologic: Normal sensory function. No focal deficits noted. Sensation intact to light touch Psychiatric: Normal mood. Age appropriate judgment and insight. Alert & oriented x 3.    Assessment:  Lateral epicondylitis of right elbow  Pain in finger of both  hands  Wrist tendonitis  Plan: Orders as above. Stretches/exercises, ice, splint, forearm strap for tennis elbow, topical nsaid prn.  F/u 4 weeks w reg pcp if no improvement to discuss possible imaging vs referral to hand.  The patient voiced understanding and agreement to the plan.   [place], DO 04/05/17  4:42 PM

## 2017-04-05 NOTE — Progress Notes (Signed)
Pre visit review using our clinic review tool, if applicable. No additional management support is needed unless otherwise documented below in the visit note. 

## 2017-04-08 ENCOUNTER — Other Ambulatory Visit: Payer: Self-pay | Admitting: Emergency Medicine

## 2017-04-08 ENCOUNTER — Telehealth: Payer: Self-pay | Admitting: Family Medicine

## 2017-04-08 DIAGNOSIS — M79644 Pain in right finger(s): Secondary | ICD-10-CM

## 2017-04-08 DIAGNOSIS — M7711 Lateral epicondylitis, right elbow: Secondary | ICD-10-CM

## 2017-04-08 DIAGNOSIS — M778 Other enthesopathies, not elsewhere classified: Secondary | ICD-10-CM

## 2017-04-08 DIAGNOSIS — M79645 Pain in left finger(s): Secondary | ICD-10-CM

## 2017-04-08 MED ORDER — DICLOFENAC SODIUM 2 % TD SOLN: 1.0000 | Freq: Four times a day (QID) | TRANSDERMAL | 1 refills | Status: DC | PRN

## 2017-04-08 NOTE — Telephone Encounter (Signed)
Copied from [place]. Topic: Inquiry >> Apr 08, 2017  5:56 PM Cecelia Byars, NT wrote: Reason for CRM: [facility]  called a gain in regards to diclofenac sodium 2%instructions bed incorrect please send a gain , call [phone] ,2 x daily is the max not 4 , and soul say apply 1 or 2 pumps daily

## 2017-04-08 NOTE — Telephone Encounter (Signed)
Copied from [place]. Topic: Inquiry >> Apr 08, 2017  5:56 PM Cecelia Byars, NT wrote: Reason for CRM: [facility] patient [facility]  called a gain in regards to diclofenac sodium 2%instructions bed incorrect please send a gain , call [phone] ,2 x daily is the max not 4 , and soul say apply 1 or 2 pumps daily

## 2017-04-08 NOTE — Telephone Encounter (Signed)
Copied from [place] [id number]. Topic: Quick Communication - Rx Refill/Question >> Apr 08, 2017 10:47 AM Eulogio Bear J wrote: Medication: Diclofenac Sodium 2 % SOLN   Has the patient contacted their pharmacy? Yes.     (Agent: If no, request that the patient contact the pharmacy for the refill.)   Preferred Pharmacy (with phone number or street name):  Pt was told that the directions were invalid.  Pharmacy told her the maximum 2 pumps twice a day.  Or one pump 4 times a day.   [facility]     Agent: Please be advised that RX refills may take up to 3 business days. We ask that you follow-up with your pharmacy.

## 2017-04-08 NOTE — Telephone Encounter (Signed)
Directions for Diclofenac Sodium 2% SOLN fixed per pharmacy request.

## 2017-04-09 NOTE — Telephone Encounter (Signed)
See previous phone notes 

## 2017-04-12 ENCOUNTER — Encounter: Payer: Self-pay | Admitting: Family Medicine

## 2017-04-12 DIAGNOSIS — M7711 Lateral epicondylitis, right elbow: Secondary | ICD-10-CM

## 2017-04-12 DIAGNOSIS — M79644 Pain in right finger(s): Secondary | ICD-10-CM

## 2017-04-12 DIAGNOSIS — M79645 Pain in left finger(s): Secondary | ICD-10-CM

## 2017-04-12 DIAGNOSIS — M778 Other enthesopathies, not elsewhere classified: Secondary | ICD-10-CM

## 2017-04-14 MED ORDER — DICLOFENAC SODIUM 2 % TD SOLN: 1.0000 | Freq: Four times a day (QID) | TRANSDERMAL | 1 refills | Status: DC | PRN

## 2017-09-22 ENCOUNTER — Other Ambulatory Visit: Payer: Self-pay | Admitting: Family Medicine

## 2017-09-22 DIAGNOSIS — F329 Major depressive disorder, single episode, unspecified: Secondary | ICD-10-CM

## 2017-09-22 DIAGNOSIS — F32A Depression, unspecified: Secondary | ICD-10-CM

## 2017-11-19 ENCOUNTER — Ambulatory Visit (INDEPENDENT_AMBULATORY_CARE_PROVIDER_SITE_OTHER): Payer: BC Managed Care – PPO | Admitting: Family Medicine

## 2017-11-19 ENCOUNTER — Encounter: Payer: Self-pay | Admitting: Family Medicine

## 2017-11-19 VITALS — BP 122/69 | Temp 98.3°F | Resp 16 | Ht 62.0 in | Wt 111.2 lb

## 2017-11-19 DIAGNOSIS — Z0000 Encounter for general adult medical examination without abnormal findings: Secondary | ICD-10-CM

## 2017-11-19 LAB — CBC WITH DIFFERENTIAL/PLATELET
BASOS ABS: 0 10*3/uL (ref 0.0–0.1)
Basophils Relative: 0.6 % (ref 0.0–3.0)
EOS PCT: 0.2 % (ref 0.0–5.0)
Eosinophils Absolute: 0 10*3/uL (ref 0.0–0.7)
HEMATOCRIT: 41.7 % (ref 36.0–46.0)
HEMOGLOBIN: 13.9 g/dL (ref 12.0–15.0)
LYMPHS PCT: 12 % (ref 12.0–46.0)
Lymphs Abs: 0.6 10*3/uL — ABNORMAL LOW (ref 0.7–4.0)
MCHC: 33.4 g/dL (ref 30.0–36.0)
MCV: 85.8 fl (ref 78.0–100.0)
MONOS PCT: 2.8 % — AB (ref 3.0–12.0)
Monocytes Absolute: 0.1 10*3/uL (ref 0.1–1.0)
NEUTROS PCT: 84.4 % — AB (ref 43.0–77.0)
Neutro Abs: 4 10*3/uL (ref 1.4–7.7)
Platelets: 279 10*3/uL (ref 150.0–400.0)
RBC: 4.86 Mil/uL (ref 3.87–5.11)
RDW: 13.5 % (ref 11.5–15.5)
WBC: 4.8 10*3/uL (ref 4.0–10.5)

## 2017-11-19 LAB — COMPREHENSIVE METABOLIC PANEL
ALT: 17 U/L (ref 0–35)
AST: 15 U/L (ref 0–37)
Albumin: 4.3 g/dL (ref 3.5–5.2)
Alkaline Phosphatase: 40 U/L (ref 39–117)
BILIRUBIN TOTAL: 0.4 mg/dL (ref 0.2–1.2)
BUN: 14 mg/dL (ref 6–23)
CALCIUM: 9.1 mg/dL (ref 8.4–10.5)
CO2: 27 meq/L (ref 19–32)
Chloride: 104 mEq/L (ref 96–112)
Creatinine, Ser: 0.68 mg/dL (ref 0.40–1.20)
GFR: 96.5 mL/min (ref >60.00)
Glucose, Bld: 88 mg/dL (ref 70–99)
Potassium: 4.4 mEq/L (ref 3.5–5.1)
Sodium: 140 mEq/L (ref 135–145)
Total Protein: 6.9 g/dL (ref 6.0–8.3)

## 2017-11-19 LAB — LIPID PANEL
CHOL/HDL RATIO: 3
Cholesterol: 178 mg/dL (ref 0–200)
HDL: 56 mg/dL (ref >39.00)
LDL CALC: 111 mg/dL — AB (ref 0–99)
NONHDL: 121.65
Triglycerides: 52 mg/dL (ref 0.0–149.0)
VLDL: 10.4 mg/dL (ref 0.0–40.0)

## 2017-11-19 LAB — TSH: TSH: 0.77 u[IU]/mL (ref 0.35–4.50)

## 2017-11-19 NOTE — Progress Notes (Signed)
Subjective:     Emily Kirby is a 52 y.o. female and is here for a comprehensive physical exam. The patient reports no problems. Pt is taking RM- flex for arthritis pain--- it works well for her.  She gets it in [place].   Social History   Socioeconomic History  . Marital status: Married    Spouse name: Not on file  . Number of children: Not on file  . Years of education: Not on file  . Highest education level: Not on file  Occupational History    Employer: [employer]    Comment: court interpreter  Social Needs  . Financial resource strain: Not on file  . Food insecurity:    Worry: Not on file    Inability: Not on file  . Transportation needs:    Medical: Not on file    Non-medical: Not on file  Tobacco Use  . Smoking status: Never Smoker  . Smokeless tobacco: Never Used  Substance and Sexual Activity  . Alcohol use: Yes    Alcohol/week: 0.0 standard drinks    Comment:  3 X PER WEEK, HAVE NOT HAD ANY IN THE LAST 1 1/2  . Drug use: No  . Sexual activity: Yes    Partners: Male  Lifestyle  . Physical activity:    Days per week: Not on file    Minutes per session: Not on file  . Stress: Not on file  Relationships  . Social connections:    Talks on phone: Not on file    Gets together: Not on file    Attends religious service: Not on file    Active member of club or organization: Not on file    Attends meetings of clubs or organizations: Not on file    Relationship status: Not on file  . Intimate partner violence:    Fear of current or ex partner: Not on file    Emotionally abused: Not on file    Physically abused: Not on file    Forced sexual activity: Not on file  Other Topics Concern  . Not on file  Social History Narrative  . Not on file   Health Maintenance  Topic Date Due  . HIV Screening  09/11/1980  . MAMMOGRAM  10/27/2016  . INFLUENZA VACCINE  07/09/2018 (Originally 10/07/2017)  . PAP SMEAR  10/09/2018  . TETANUS/TDAP  10/10/2023   . COLONOSCOPY  06/30/2026    The following portions of the patient's history were reviewed and updated as appropriate:  She  has a past medical history of AC (acromioclavicular) joint bone spurs, Allergy, Chicken pox, Kidney stones, and Uterine fibroid. She does not have any pertinent problems on file. She  has a past surgical history that includes Liposuction; Cesarean section; and Dilation and curettage of uterus. Her family history includes COPD in her father; Cancer in her father; Hearing loss in her father; Heart disease in her father; Hypertension in her mother; Prostate cancer in her father. She  reports that she has never smoked. She has never used smokeless tobacco. She reports that she drinks alcohol. She reports that she does not use drugs. She has a current medication list which includes the following prescription(s): bupropion, diclofenac sodium, and norethin ace-eth estrad-fe, and the following Facility-Administered Medications: sodium chloride. Current Outpatient Medications on File Prior to Visit  Medication Sig Dispense Refill  . buPROPion (WELLBUTRIN XL) 300 MG 24 hr tablet TAKE 1 TABLET BY MOUTH EVERY DAY 90 tablet 1  .  Diclofenac Sodium 2 % SOLN Apply 1 Pump topically 4 (four) times daily as needed. For pain. Thin layer. 1 Bottle 1  . Norethin Ace-Eth Estrad-FE (TAYTULLA) 1-20 MG-MCG(24) CAPS Take 1 capsule by mouth daily.     Current Facility-Administered Medications on File Prior to Visit  Medication Dose Route Frequency Provider Last Rate Last Dose  . 0.9 %  sodium chloride infusion  500 mL Intravenous Continuous Irene Shipper, MD       She is allergic to codeine and pseudoephedrine..  Review of Systems Review of Systems  Constitutional: Negative for activity change, appetite change and fatigue.  HENT: Negative for hearing loss, congestion, tinnitus and ear discharge.  dentist q84m Eyes: Negative for visual disturbance (see optho q1y -- vision corrected to 20/20 with  glasses).  Respiratory: Negative for cough, chest tightness and shortness of breath.   Cardiovascular: Negative for chest pain, palpitations and leg swelling.  Gastrointestinal: Negative for abdominal pain, diarrhea, constipation and abdominal distention.  Genitourinary: Negative for urgency, frequency, decreased urine volume and difficulty urinating.  Musculoskeletal: Negative for back pain, arthralgias and gait problem.  Skin: Negative for color change, pallor and rash.  Neurological: Negative for dizziness, light-headedness, numbness and headaches.  Hematological: Negative for adenopathy. Does not bruise/bleed easily.  Psychiatric/Behavioral: Negative for suicidal ideas, confusion, sleep disturbance, self-injury, dysphoric mood, decreased concentration and agitation.       Objective:    BP 122/69 (BP Location: Right Arm, Patient Position: Sitting, Cuff Size: Small)   Temp 98.3 F (36.8 C) (Oral)   Resp 16   Ht 5\' 2"  (1.575 m)   Wt 111 lb 3.2 oz (50.4 kg)   SpO2 99%   BMI 20.34 kg/m  General appearance: alert, cooperative, appears stated age and no distress Head: Normocephalic, without obvious abnormality, atraumatic Eyes: conjunctivae/corneas clear. PERRL, EOM's intact. Fundi benign. Ears: normal TM's and external ear canals both ears Nose: Nares normal. Septum midline. Mucosa normal. No drainage or sinus tenderness. Throat: lips, mucosa, and tongue normal; teeth and gums normal Neck: no adenopathy, no carotid bruit, no JVD, supple, symmetrical, trachea midline and thyroid not enlarged, symmetric, no tenderness/mass/nodules Back: symmetric, no curvature. ROM normal. No CVA tenderness. Lungs: clear to auscultation bilaterally Breasts: gyn Heart: regular rate and rhythm, S1, S2 normal, no murmur, click, rub or gallop Abdomen: soft, non-tender; bowel sounds normal; no masses,  no organomegaly Pelvic: deferred--gyn Extremities: extremities normal, atraumatic, no cyanosis or  edema Pulses: 2+ and symmetric Skin: Skin color, texture, turgor normal. No rashes or lesions Lymph nodes: Cervical, supraclavicular, and axillary nodes normal. Neurologic: Alert and oriented X 3, normal strength and tone. Normal symmetric reflexes. Normal coordination and gait    Assessment:    Healthy female exam.      Plan:  ghm utd Check labs   See After Visit Summary for Counseling Recommendations    1. Preventative health care See above - CBC with Differential/Platelet - Lipid panel - TSH - Comprehensive metabolic panel

## 2017-11-19 NOTE — Patient Instructions (Signed)
Preventive Care 40-64 Years, Female Preventive care refers to lifestyle choices and visits with your health care provider that can promote health and wellness. What does preventive care include?  A yearly physical exam. This is also called an annual well check.  Dental exams once or twice a year.  Routine eye exams. Ask your health care provider how often you should have your eyes checked.  Personal lifestyle choices, including: ? Daily care of your teeth and gums. ? Regular physical activity. ? Eating a healthy diet. ? Avoiding tobacco and drug use. ? Limiting alcohol use. ? Practicing safe sex. ? Taking low-dose aspirin daily starting at age 58. ? Taking vitamin and mineral supplements as recommended by your health care provider. What happens during an annual well check? The services and screenings done by your health care provider during your annual well check will depend on your age, overall health, lifestyle risk factors, and family history of disease. Counseling Your health care provider may ask you questions about your:  Alcohol use.  Tobacco use.  Drug use.  Emotional well-being.  Home and relationship well-being.  Sexual activity.  Eating habits.  Work and work Statistician.  Method of birth control.  Menstrual cycle.  Pregnancy history.  Screening You may have the following tests or measurements:  Height, weight, and BMI.  Blood pressure.  Lipid and cholesterol levels. These may be checked every 5 years, or more frequently if you are over 81 years old.  Skin check.  Lung cancer screening. You may have this screening every year starting at age 78 if you have a 30-pack-year history of smoking and currently smoke or have quit within the past 15 years.  Fecal occult blood test (FOBT) of the stool. You may have this test every year starting at age 65.  Flexible sigmoidoscopy or colonoscopy. You may have a sigmoidoscopy every 5 years or a colonoscopy  every 10 years starting at age 30.  Hepatitis C blood test.  Hepatitis B blood test.  Sexually transmitted disease (STD) testing.  Diabetes screening. This is done by checking your blood sugar (glucose) after you have not eaten for a while (fasting). You may have this done every 1-3 years.  Mammogram. This may be done every 1-2 years. Talk to your health care provider about when you should start having regular mammograms. This may depend on whether you have a family history of breast cancer.  BRCA-related cancer screening. This may be done if you have a family history of breast, ovarian, tubal, or peritoneal cancers.  Pelvic exam and Pap test. This may be done every 3 years starting at age 80. Starting at age 36, this may be done every 5 years if you have a Pap test in combination with an HPV test.  Bone density scan. This is done to screen for osteoporosis. You may have this scan if you are at high risk for osteoporosis.  Discuss your test results, treatment options, and if necessary, the need for more tests with your health care provider. Vaccines Your health care provider may recommend certain vaccines, such as:  Influenza vaccine. This is recommended every year.  Tetanus, diphtheria, and acellular pertussis (Tdap, Td) vaccine. You may need a Td booster every 10 years.  Varicella vaccine. You may need this if you have not been vaccinated.  Zoster vaccine. You may need this after age 5.  Measles, mumps, and rubella (MMR) vaccine. You may need at least one dose of MMR if you were born in  1957 or later. You may also need a second dose.  Pneumococcal 13-valent conjugate (PCV13) vaccine. You may need this if you have certain conditions and were not previously vaccinated.  Pneumococcal polysaccharide (PPSV23) vaccine. You may need one or two doses if you smoke cigarettes or if you have certain conditions.  Meningococcal vaccine. You may need this if you have certain  conditions.  Hepatitis A vaccine. You may need this if you have certain conditions or if you travel or work in places where you may be exposed to hepatitis A.  Hepatitis B vaccine. You may need this if you have certain conditions or if you travel or work in places where you may be exposed to hepatitis B.  Haemophilus influenzae type b (Hib) vaccine. You may need this if you have certain conditions.  Talk to your health care provider about which screenings and vaccines you need and how often you need them. This information is not intended to replace advice given to you by your health care provider. Make sure you discuss any questions you have with your health care provider. Document Released: 03/22/2015 Document Revised: 11/13/2015 Document Reviewed: 12/25/2014 Elsevier Interactive Patient Education  2018 Elsevier Inc.  

## 2017-11-24 ENCOUNTER — Telehealth: Payer: Self-pay | Admitting: *Deleted

## 2017-11-24 NOTE — Telephone Encounter (Signed)
Received Medical records from [facility]; forwarded to provider/SLS 09/18

## 2017-11-26 ENCOUNTER — Ambulatory Visit (INDEPENDENT_AMBULATORY_CARE_PROVIDER_SITE_OTHER): Payer: BC Managed Care – PPO

## 2017-11-26 DIAGNOSIS — Z23 Encounter for immunization: Secondary | ICD-10-CM | POA: Diagnosis not present

## 2017-12-08 ENCOUNTER — Encounter: Payer: Self-pay | Admitting: *Deleted

## 2017-12-21 ENCOUNTER — Telehealth: Payer: Self-pay | Admitting: *Deleted

## 2017-12-21 NOTE — Telephone Encounter (Signed)
Received Medical records from [facility]; forwarded to provider/SLS 10/15

## 2018-01-26 ENCOUNTER — Ambulatory Visit: Payer: BC Managed Care – PPO

## 2018-01-28 ENCOUNTER — Ambulatory Visit: Payer: BC Managed Care – PPO

## 2018-02-02 ENCOUNTER — Ambulatory Visit (INDEPENDENT_AMBULATORY_CARE_PROVIDER_SITE_OTHER): Payer: BC Managed Care – PPO

## 2018-02-02 DIAGNOSIS — Z23 Encounter for immunization: Secondary | ICD-10-CM

## 2018-03-10 ENCOUNTER — Encounter: Payer: Self-pay | Admitting: Medical

## 2018-03-10 ENCOUNTER — Ambulatory Visit: Payer: BC Managed Care – PPO | Admitting: Medical

## 2018-03-10 ENCOUNTER — Telehealth: Payer: Self-pay | Admitting: *Deleted

## 2018-03-10 VITALS — BP 113/70 | HR 89 | Temp 98.3°F | Resp 16 | Ht 62.0 in | Wt 112.0 lb

## 2018-03-10 DIAGNOSIS — J40 Bronchitis, not specified as acute or chronic: Secondary | ICD-10-CM

## 2018-03-10 DIAGNOSIS — M791 Myalgia, unspecified site: Secondary | ICD-10-CM

## 2018-03-10 DIAGNOSIS — H6690 Otitis media, unspecified, unspecified ear: Secondary | ICD-10-CM

## 2018-03-10 DIAGNOSIS — J040 Acute laryngitis: Secondary | ICD-10-CM

## 2018-03-10 DIAGNOSIS — J0100 Acute maxillary sinusitis, unspecified: Secondary | ICD-10-CM

## 2018-03-10 LAB — POC INFLUENZA A&B (BINAX/QUICKVUE)
Influenza A, POC: NEGATIVE
Influenza B, POC: NEGATIVE

## 2018-03-10 LAB — POCT RAPID STREP A (OFFICE): Rapid Strep A Screen: NEGATIVE

## 2018-03-10 MED ORDER — BENZONATATE 100 MG PO CAPS: 100.0000 mg | ORAL_CAPSULE | Freq: Three times a day (TID) | ORAL | 0 refills | Status: DC | PRN

## 2018-03-10 MED ORDER — AMOXICILLIN-POT CLAVULANATE 875-125 MG PO TABS: 1.0000 | ORAL_TABLET | Freq: Two times a day (BID) | ORAL | 0 refills | Status: DC

## 2018-03-10 MED ORDER — FLUTICASONE PROPIONATE 50 MCG/ACT NA SUSP: 2.0000 | Freq: Every day | NASAL | 1 refills | Status: DC

## 2018-03-10 NOTE — Telephone Encounter (Signed)
Patient seen Emily Kirby today.  We did not have any openings

## 2018-03-10 NOTE — Patient Instructions (Addendum)
By recent history and physical exam it appears that you have sinus infection and left side ear infection.  Some bronchitis as well.  I am prescribing Augmentin antibiotic and benzonatate for your cough.  For nasal congestion, prescribed Flonase. You do have some laryngitis as well and recommend that you rest your voice and make sure you are well-hydrated.  Your rapid strep test came back and negative.  In the event of false negative rapid strep test Augmentin has coverage for strep.  Early on you did report some myalgias.  So we did get rapid flu test today.  Staff will let you know results of your flu test.(flu came back negative)  Follow-up in 7 to 10 days or as needed.

## 2018-03-10 NOTE — Telephone Encounter (Signed)
Copied from [place] [id number]. Topic: General - Other >> Mar 10, 2018  8:04 AM Leward Quan A wrote: Reason for CRM: Patient has been ill for about 4 days now and wanted to see Dr Cheri Rous but appointments were at maximum limit. Patient was scheduled to see Sagiuer at 3.40 pm but if something comes available before then patient would like a call please at Ph# [phone] or Husband Domingo Dimes at [phone]

## 2018-03-10 NOTE — Progress Notes (Signed)
Subjective:    Patient ID: Emily Kirby, female    DOB: 09-Nov-1965, 53 y.o.   MRN: [id number]  HPI  Pt in feeling sick since Sunday and Monday. She has st, laryngitis, sinus congestion and ear pain.   Some productive cough. When cough has chest wall pain.  Pt states feel slight constant dizziness since yesterday.  Pt has been using sudafed. She states in past sudafed made her feel dizzy and tingly in body.(she will stop sudafed today)     Review of Systems  Constitutional: Positive for chills, diaphoresis and fatigue. Negative for fever.  HENT: Positive for congestion and ear pain. Negative for postnasal drip.   Respiratory: Positive for cough. Negative for chest tightness, shortness of breath and wheezing.   Cardiovascular: Negative for chest pain and palpitations.  Musculoskeletal: Positive for myalgias. Negative for back pain.  Neurological: Negative for dizziness, speech difficulty, weakness and headaches.  Hematological: Negative for adenopathy. Does not bruise/bleed easily.  Psychiatric/Behavioral: Negative for behavioral problems and confusion.    Past Medical History:  Diagnosis Date  . AC (acromioclavicular) joint bone spurs   . Allergy    SEASONAL  . Chicken pox   . Kidney stones   . Uterine fibroid      Social History   Socioeconomic History  . Marital status: Married    Spouse name: Not on file  . Number of children: Not on file  . Years of education: Not on file  . Highest education level: Not on file  Occupational History    Employer: [employer]    Comment: court interpreter  Social Needs  . Financial resource strain: Not on file  . Food insecurity:    Worry: Not on file    Inability: Not on file  . Transportation needs:    Medical: Not on file    Non-medical: Not on file  Tobacco Use  . Smoking status: Never Smoker  . Smokeless tobacco: Never Used  Substance and Sexual Activity  . Alcohol use: Yes    Alcohol/week:  0.0 standard drinks    Comment:  3 X PER WEEK, HAVE NOT HAD ANY IN THE LAST 1 1/2  . Drug use: No  . Sexual activity: Yes    Partners: Male  Lifestyle  . Physical activity:    Days per week: Not on file    Minutes per session: Not on file  . Stress: Not on file  Relationships  . Social connections:    Talks on phone: Not on file    Gets together: Not on file    Attends religious service: Not on file    Active member of club or organization: Not on file    Attends meetings of clubs or organizations: Not on file    Relationship status: Not on file  . Intimate partner violence:    Fear of current or ex partner: Not on file    Emotionally abused: Not on file    Physically abused: Not on file    Forced sexual activity: Not on file  Other Topics Concern  . Not on file  Social History Narrative  . Not on file    Past Surgical History:  Procedure Laterality Date  . CESAREAN SECTION    . DILATION AND CURETTAGE OF UTERUS    . LIPOSUCTION      Family History  Problem Relation Age of Onset  . Cancer Father        prostate  .  Hearing loss Father   . COPD Father   . Heart disease Father        MI  . Prostate cancer Father   . Hypertension Mother   . Colon cancer Neg Hx     Allergies  Allergen Reactions  . Codeine Other (See Comments)    Stomach upset  . Pseudoephedrine Other (See Comments)    Tingling all over body    Current Outpatient Medications on File Prior to Visit  Medication Sig Dispense Refill  . buPROPion (WELLBUTRIN XL) 300 MG 24 hr tablet TAKE 1 TABLET BY MOUTH EVERY DAY 90 tablet 1  . Diclofenac Sodium 2 % SOLN Apply 1 Pump topically 4 (four) times daily as needed. For pain. Thin layer. 1 Bottle 1  . Norethin Ace-Eth Estrad-FE (TAYTULLA) 1-20 MG-MCG(24) CAPS Take 1 capsule by mouth daily.     Current Facility-Administered Medications on File Prior to Visit  Medication Dose Route Frequency Provider Last Rate Last Dose  . 0.9 %  sodium chloride infusion   500 mL Intravenous Continuous Irene Shipper, MD        BP 113/70   Pulse 89   Temp 98.3 F (36.8 C) (Oral)   Resp 16   Ht 5\' 2"  (1.575 m)   Wt 112 lb (50.8 kg)   SpO2 99%   BMI 20.49 kg/m       Objective:   Physical Exam  General  Mental Status - Alert. General Appearance - Well groomed. Not in acute distress.  Skin Rashes- No Rashes.  HEENT Head- Normal. Ear Auditory Canal - Left- Normal. Right - Normal.Tympanic Membrane- Left- mild central red. Right- Normal. Eye Sclera/Conjunctiva- Left- Normal. Right- Normal. Nose & Sinuses Nasal Mucosa- Left-  Boggy and Congested. Right-  Boggy and  Congested.Bilateral maxillary and frontal sinus pressure. Mouth & Throat Lips: Upper Lip- Normal: no dryness, cracking, pallor, cyanosis, or vesicular eruption. Lower Lip-Normal: no dryness, cracking, pallor, cyanosis or vesicular eruption. Buccal Mucosa- Bilateral- No Aphthous ulcers. Oropharynx- No Discharge or Erythema. Tonsils: Characteristics- Bilateral- No Erythema or Congestion. Size/Enlargement- Bilateral- No enlargement. Discharge- bilateral-None.  Neck Neck- Supple. No Masses.   Chest and Lung Exam Auscultation: Breath Sounds:-Clear even and unlabored.  Cardiovascular Auscultation:Rythm- Regular, rate and rhythm. Murmurs & Other Heart Sounds:Ausculatation of the heart reveal- No Murmurs.  Lymphatic Head & Neck General Head & Neck Lymphatics: Bilateral: Description- No Localized lymphadenopathy.       Assessment & Plan:  By recent history and physical exam it appears that you have sinus infection and left side ear infection.  Some bronchitis as well.  I am prescribing Augmentin antibiotic and benzonatate for your cough.  For nasal congestion, prescribed Flonase. You do have some laryngitis as well and recommend that you rest your voice and make sure you are well-hydrated.  Your rapid strep test came back and negative.  In the event of false negative rapid  strep test Augmentin has coverage for strep.  Early on you did report some myalgias.  So we did get rapid flu test today.  Staff will let you know results of your flu test.(flu came back negative)  Follow-up in 7 to 10 days or as needed.  Mackie Pai, PA-C

## 2018-03-18 ENCOUNTER — Other Ambulatory Visit: Payer: Self-pay | Admitting: Family Medicine

## 2018-03-18 DIAGNOSIS — F329 Major depressive disorder, single episode, unspecified: Secondary | ICD-10-CM

## 2018-03-18 DIAGNOSIS — F32A Depression, unspecified: Secondary | ICD-10-CM

## 2018-03-24 ENCOUNTER — Ambulatory Visit (INDEPENDENT_AMBULATORY_CARE_PROVIDER_SITE_OTHER): Payer: BC Managed Care – PPO | Admitting: Family Medicine

## 2018-03-24 ENCOUNTER — Encounter: Payer: Self-pay | Admitting: Family Medicine

## 2018-03-24 VITALS — BP 116/64 | HR 90 | Temp 98.7°F | Ht 62.0 in | Wt 111.4 lb

## 2018-03-24 DIAGNOSIS — R05 Cough: Secondary | ICD-10-CM | POA: Diagnosis not present

## 2018-03-24 DIAGNOSIS — J029 Acute pharyngitis, unspecified: Secondary | ICD-10-CM

## 2018-03-24 DIAGNOSIS — R059 Cough, unspecified: Secondary | ICD-10-CM

## 2018-03-24 MED ORDER — PREDNISONE 10 MG PO TABS: ORAL_TABLET | ORAL | 0 refills | Status: DC

## 2018-03-24 MED ORDER — PROMETHAZINE-DM 6.25-15 MG/5ML PO SYRP: 5.0000 mL | ORAL_SOLUTION | Freq: Four times a day (QID) | ORAL | 0 refills | Status: DC | PRN

## 2018-03-24 MED ORDER — METHYLPREDNISOLONE ACETATE 80 MG/ML IJ SUSP
80.0000 mg | Freq: Once | INTRAMUSCULAR | Status: AC
  Administered 2018-03-24: 80 mg via INTRAMUSCULAR

## 2018-03-24 NOTE — Patient Instructions (Signed)

## 2018-03-24 NOTE — Progress Notes (Signed)
Patient ID: Emily Kirby, female    DOB: 12-04-1965  Age: 53 y.o. MRN: [id number]    Subjective:  Subjective  HPI Emily Kirby presents for con't sore throat and clearing throat constantly. She was seen earlier this month and started augmentin-- -she finished Sunday  Review of Systems  Constitutional: Negative for appetite change, diaphoresis, fatigue and unexpected weight change.  HENT: Positive for congestion and sore throat.   Eyes: Negative for pain, redness and visual disturbance.  Respiratory: Negative for cough, chest tightness, shortness of breath and wheezing.   Cardiovascular: Negative for chest pain, palpitations and leg swelling.  Endocrine: Negative for cold intolerance, heat intolerance, polydipsia, polyphagia and polyuria.  Genitourinary: Negative for difficulty urinating, dysuria and frequency.  Neurological: Negative for dizziness, light-headedness, numbness and headaches.    History Past Medical History:  Diagnosis Date  . AC (acromioclavicular) joint bone spurs   . Allergy    SEASONAL  . Chicken pox   . Kidney stones   . Uterine fibroid     She has a past surgical history that includes Liposuction; Cesarean section; and Dilation and curettage of uterus.   Her family history includes COPD in her father; Cancer in her father; Hearing loss in her father; Heart disease in her father; Hypertension in her mother; Prostate cancer in her father.She reports that she has never smoked. She has never used smokeless tobacco. She reports current alcohol use. She reports that she does not use drugs.  Current Outpatient Medications on File Prior to Visit  Medication Sig Dispense Refill  . buPROPion (WELLBUTRIN XL) 300 MG 24 hr tablet TAKE 1 TABLET BY MOUTH EVERY DAY 90 tablet 1  . Diclofenac Sodium 2 % SOLN Apply 1 Pump topically 4 (four) times daily as needed. For pain. Thin layer. 1 Bottle 1  . fluticasone (FLONASE) 50 MCG/ACT nasal spray Place 2 sprays into both nostrils  daily. 16 g 1  . Norethin Ace-Eth Estrad-FE (TAYTULLA) 1-20 MG-MCG(24) CAPS Take 1 capsule by mouth daily.     No current facility-administered medications on file prior to visit.      Objective:  Objective  Physical Exam Vitals signs and nursing note reviewed.  Constitutional:      Appearance: She is well-developed.  HENT:     Head: Normocephalic and atraumatic.     Right Ear: External ear normal.     Left Ear: External ear normal.     Nose:     Right Sinus: Maxillary sinus tenderness and frontal sinus tenderness present.     Left Sinus: Maxillary sinus tenderness and frontal sinus tenderness present.  Eyes:     General:        Right eye: No discharge.        Left eye: No discharge.     Conjunctiva/sclera: Conjunctivae normal.  Neck:     Musculoskeletal: Normal range of motion and neck supple.     Thyroid: No thyromegaly.     Vascular: No carotid bruit or JVD.  Cardiovascular:     Rate and Rhythm: Normal rate and regular rhythm.     Heart sounds: Normal heart sounds. No murmur.  Pulmonary:     Effort: Pulmonary effort is normal. No respiratory distress.     Breath sounds: Decreased breath sounds present. No wheezing or rales.  Chest:     Chest wall: No tenderness.  Lymphadenopathy:     Cervical: No cervical adenopathy.  Neurological:     Mental Status: She is alert and oriented to  person, place, and time.    BP 116/64 (BP Location: Right Arm, Patient Position: Sitting, Cuff Size: Normal)   Pulse 90   Temp 98.7 F (37.1 C) (Oral)   Ht 5\' 2"  (1.575 m)   Wt 111 lb 6.4 oz (50.5 kg)   SpO2 97%   BMI 20.38 kg/m  Wt Readings from Last 3 Encounters:  03/24/18 111 lb 6.4 oz (50.5 kg)  03/10/18 112 lb (50.8 kg)  11/19/17 111 lb 3.2 oz (50.4 kg)     Lab Results  Component Value Date   WBC 4.8 11/19/2017   HGB 13.9 11/19/2017   HCT 41.7 11/19/2017   PLT 279.0 11/19/2017   GLUCOSE 88 11/19/2017   CHOL 178 11/19/2017   TRIG 52.0 11/19/2017   HDL 56.00 11/19/2017     LDLCALC 111 (H) 11/19/2017   ALT 17 11/19/2017   AST 15 11/19/2017   NA 140 11/19/2017   K 4.4 11/19/2017   CL 104 11/19/2017   CREATININE 0.68 11/19/2017   BUN 14 11/19/2017   CO2 27 11/19/2017   TSH 0.77 11/19/2017    US Aspiration  Result Date: 12/06/2015 CLINICAL DATA:  53 year old female presenting for ultrasound-guided aspiration of a right breast cyst. EXAM: ULTRASOUND GUIDED RIGHT BREAST CYST ASPIRATION COMPARISON:  Previous exams. PROCEDURE: Using sterile technique, 1% lidocaine, under direct ultrasound visualization, needle aspiration of a cyst in the right breast at 4 o'clock was performed. The cyst aspirated to complete resolution, and the fluid was discarded. Next, using sterile technique, 1% lidocaine, under direct ultrasound visualization, needle aspiration of a cyst in the right breast at 9 o'clock was performed. This cyst also aspirated completely to resolution, and the fluid was discarded. IMPRESSION: Ultrasound-guided aspiration of 2 right breast cysts, at 4 o'clock and 9 o'clock No apparent complications. RECOMMENDATIONS: Return to routine screening mammography is recommended. The patient will be due for screening in August of 2018. Electronically Signed   By: Emily Kirby M.D.   On: 12/06/2015 08:05     Assessment & Plan:  Plan  I have discontinued Emily Kirby's amoxicillin-clavulanate and benzonatate. I am also having her start on promethazine-dextromethorphan and predniSONE. Additionally, I am having her maintain her Norethin Ace-Eth Estrad-FE, Diclofenac Sodium, fluticasone, and buPROPion. We will stop administering sodium chloride. Additionally, we administered methylPREDNISolone acetate.  Meds ordered this encounter  Medications  . promethazine-dextromethorphan (PROMETHAZINE-DM) 6.25-15 MG/5ML syrup    Sig: Take 5 mLs by mouth 4 (four) times daily as needed.    Dispense:  118 mL    Refill:  0  . predniSONE (DELTASONE) 10 MG tablet    Sig: TAKE 3 TABLETS  PO QD FOR 3 DAYS THEN TAKE 2 TABLETS PO QD FOR 3 DAYS THEN TAKE 1 TABLET PO QD FOR 3 DAYS THEN TAKE 1/2 TAB PO QD FOR 3 DAYS    Dispense:  20 tablet    Refill:  0  . methylPREDNISolone acetate (DEPO-MEDROL) injection 80 mg    Problem List Items Addressed This Visit      Unprioritized   Cough - Primary    Post infectious  pred taper Cough meds  rto prn       Relevant Medications   promethazine-dextromethorphan (PROMETHAZINE-DM) 6.25-15 MG/5ML syrup   predniSONE (DELTASONE) 10 MG tablet   methylPREDNISolone acetate (DEPO-MEDROL) injection 80 mg (Completed)   Pharyngitis   Relevant Medications   methylPREDNISolone acetate (DEPO-MEDROL) injection 80 mg (Completed)   Other Relevant Orders   Culture, Group A Strep (Completed)  Follow-up: Return if symptoms worsen or fail to improve.  [name], DO

## 2018-03-26 LAB — CULTURE, GROUP A STREP
MICRO NUMBER:: 65133
SPECIMEN QUALITY:: ADEQUATE

## 2018-03-27 DIAGNOSIS — R05 Cough: Secondary | ICD-10-CM | POA: Insufficient documentation

## 2018-03-27 DIAGNOSIS — R059 Cough, unspecified: Secondary | ICD-10-CM | POA: Insufficient documentation

## 2018-03-27 DIAGNOSIS — J029 Acute pharyngitis, unspecified: Secondary | ICD-10-CM | POA: Insufficient documentation

## 2018-03-27 NOTE — Assessment & Plan Note (Signed)
Post infectious  pred taper Cough meds  rto prn

## 2018-08-22 ENCOUNTER — Ambulatory Visit (INDEPENDENT_AMBULATORY_CARE_PROVIDER_SITE_OTHER): Payer: BC Managed Care – PPO | Admitting: Family

## 2018-08-22 DIAGNOSIS — H109 Unspecified conjunctivitis: Secondary | ICD-10-CM

## 2018-08-22 MED ORDER — NEOMYCIN-POLYMYXIN-HC OP SUSP: OPHTHALMIC | 0 refills | Status: DC

## 2018-08-22 NOTE — Progress Notes (Signed)
Virtual Visit via Video Note  I connected with Tamrah Orton on 08/22/18 at 12:20 PM EDT by a video enabled telemedicine application and verified that I am speaking with the correct person using two identifiers. This visit type was conducted due to national recommendations for restrictions regarding the COVID-19 Pandemic (e.g. social distancing).  This format is felt to be most appropriate for this patient at this time.   I discussed the limitations of evaluation and management by telemedicine and the availability of in person appointments. The patient expressed understanding and agreed to proceed.  Only the patient and myself were on today's video visit. The patient was at home and I was in my office at the time of today's visit.   History of Present Illness:  Patient is a 53 yr old female who presents today with chief complaint of eye redness. Reports that redness began on Thursday 08/18/18.   She reports some associated irritation. Purchased otc visine without much improvement. She continues daily zyrtec and reports that her allergies seem well controlled.      Observations/Objective:   Gen: Awake, alert, no acute distress Resp: Breathing is even and non-labored Psych: calm/pleasant demeanor Neuro: Alert and Oriented x 3, + facial symmetry, speech is clear. Eyes:  Her left eye is injected laterally    Assessment and Plan:  Conjunctivitis- will rx with neomycin-polymxin-HC drops.  Pt is advised to call if symptoms worsen or if not improved in 2-3 days. She verbalizes understanding.  Follow Up Instructions:    I discussed the assessment and treatment plan with the patient. The patient was provided an opportunity to ask questions and all were answered. The patient agreed with the plan and demonstrated an understanding of the instructions.   The patient was advised to call back or seek an in-person evaluation if the symptoms worsen or if the condition fails to improve as anticipated.     Nance Pear, NP

## 2018-08-23 ENCOUNTER — Telehealth: Payer: Self-pay | Admitting: Family Medicine

## 2018-08-23 NOTE — Telephone Encounter (Signed)
NEOMYCIN-POLYMYXIN-HC, OPHTH, SUSP   [id number] - [place], [place] - [address] AT [facility] OF [address] [phone] (Phone) [phone] (Fax)

## 2018-08-24 ENCOUNTER — Other Ambulatory Visit: Payer: Self-pay | Admitting: Family Medicine

## 2018-08-24 DIAGNOSIS — H1033 Unspecified acute conjunctivitis, bilateral: Secondary | ICD-10-CM

## 2018-08-24 MED ORDER — MOXIFLOXACIN HCL 0.5 % OP SOLN: 1.0000 [drp] | Freq: Three times a day (TID) | OPHTHALMIC | 0 refills | Status: DC

## 2018-08-24 NOTE — Telephone Encounter (Signed)
[facility] not have NEOMYCIN-POLYMYXIN-HC, OPHTH, SUSP in stock. [facility] stating her symptoms have worsened and does not want infection to spread to other eye. Patient would like a follow up call when alternate Rx is sent, please advise   [id number] [place], [place]

## 2018-08-24 NOTE — Telephone Encounter (Signed)
vigamox sent to pharmacy

## 2018-08-24 NOTE — Telephone Encounter (Signed)
Please advise 

## 2018-09-08 ENCOUNTER — Other Ambulatory Visit: Payer: Self-pay | Admitting: Family Medicine

## 2018-09-08 DIAGNOSIS — F329 Major depressive disorder, single episode, unspecified: Secondary | ICD-10-CM

## 2018-09-08 DIAGNOSIS — F32A Depression, unspecified: Secondary | ICD-10-CM

## 2018-10-05 ENCOUNTER — Other Ambulatory Visit: Payer: Self-pay | Admitting: Medical

## 2018-10-05 ENCOUNTER — Other Ambulatory Visit: Payer: Self-pay

## 2018-10-05 ENCOUNTER — Telehealth: Payer: BC Managed Care – PPO | Admitting: Nurse Practitioner

## 2018-10-05 DIAGNOSIS — J029 Acute pharyngitis, unspecified: Secondary | ICD-10-CM | POA: Diagnosis not present

## 2018-10-05 DIAGNOSIS — R51 Headache: Secondary | ICD-10-CM

## 2018-10-05 DIAGNOSIS — R519 Headache, unspecified: Secondary | ICD-10-CM

## 2018-10-05 DIAGNOSIS — Z20822 Contact with and (suspected) exposure to covid-19: Secondary | ICD-10-CM

## 2018-10-05 DIAGNOSIS — R6889 Other general symptoms and signs: Secondary | ICD-10-CM

## 2018-10-05 NOTE — Progress Notes (Signed)
E-Visit for Corona Virus Screening    We are currently not in flu season , so the changes of this being the flu is very slim.  You have symptoms of covid and should be tested. See  Instructions below. If covid test comes back negatove and still have sore throat let us know. Your sore thorat is probably viral and you can treat symptoms with tylenol as well as gargling with salt water.  Your current symptoms could be consistent with the coronavirus.  Many health care providers can now test patients at their office but not all are.  [facility] has multiple testing sites. For information on our COVID testing locations and hours go to [email or web address]  Please quarantine yourself while awaiting your test results.  We are enrolling you in our [place] for [place] . Daily you will receive a questionnaire within the [facility] website. Our COVID 19 response team willl be monitoriing your responses daily.    COVID-19 is a respiratory illness with symptoms that are similar to the flu. Symptoms are typically mild to moderate, but there have been cases of severe illness and death due to the virus. The following symptoms may appear 2-14 days after exposure: . Fever . Cough . Shortness of breath or difficulty breathing . Chills . Repeated shaking with chills . Muscle pain . Headache . Sore throat . New loss of taste or smell . Fatigue . Congestion or runny nose . Nausea or vomiting . Diarrhea  It is vitally important that if you feel that you have an infection such as this virus or any other virus that you stay home and away from places where you may spread it to others.  You should self-quarantine for 14 days if you have symptoms that could potentially be coronavirus or have been in close contact a with a person diagnosed with COVID-19 within the last 2 weeks. You should avoid contact with people age 30 and older.   You should wear a mask or cloth face  covering over your nose and mouth if you must be around other people or animals, including pets (even at home). Try to stay at least 6 feet away from other people. This will protect the people around you.  You can use medication such as delsym or mucinex if develop a cough.  You may also take acetaminophen (Tylenol) as needed for fever.   Reduce your risk of any infection by using the same precautions used for avoiding the common cold or flu:  Marland Kitchen Wash your hands often with soap and warm water for at least 20 seconds.  If soap and water are not readily available, use an alcohol-based hand sanitizer with at least 60% alcohol.  . If coughing or sneezing, cover your mouth and nose by coughing or sneezing into the elbow areas of your shirt or coat, into a tissue or into your sleeve (not your hands). . Avoid shaking hands with others and consider head nods or verbal greetings only. . Avoid touching your eyes, nose, or mouth with unwashed hands.  . Avoid close contact with people who are sick. . Avoid places or events with large numbers of people in one location, like concerts or sporting events. . Carefully consider travel plans you have or are making. . If you are planning any travel outside or inside the [place], visit the CDC's Travelers' Health webpage for the latest health notices. . If you have some symptoms but not all symptoms, continue to monitor at home  and seek medical attention if your symptoms worsen. . If you are having a medical emergency, call 911.  HOME CARE . Only take medications as instructed by your medical team. . Drink plenty of fluids and get plenty of rest. . A steam or ultrasonic humidifier can help if you have congestion.   GET HELP RIGHT AWAY IF YOU HAVE EMERGENCY WARNING SIGNS** FOR COVID-19. If you or someone is showing any of these signs seek emergency medical care immediately. Call 911 or proceed to your closest emergency facility if: . You develop worsening high  fever. . Trouble breathing . Bluish lips or face . Persistent pain or pressure in the chest . New confusion . Inability to wake or stay awake . You cough up blood. . Your symptoms become more severe  **This list is not all possible symptoms. Contact your medical provider for any symptoms that are sever or concerning to you.   MAKE SURE YOU   Understand these instructions.  Will watch your condition.  Will get help right away if you are not doing well or get worse.  Your e-visit answers were reviewed by a board certified advanced clinical practitioner to complete your personal care plan.  Depending on the condition, your plan could have included both over the counter or prescription medications.  If there is a problem please reply once you have received a response from your provider.  Your safety is important to [place].  If you have drug allergies check your prescription carefully.    You can use MyChart to ask questions about today's visit, request a non-urgent call back, or ask for a work or school excuse for 24 hours related to this e-Visit. If it has been greater than 24 hours you will need to follow up with your provider, or enter a new e-Visit to address those concerns. You will get an e-mail in the next two days asking about your experience.  I hope that your e-visit has been valuable and will speed your recovery. Thank you for using e-visits.  5-10 minutes spent reviewing and documenting in chart.

## 2018-10-06 ENCOUNTER — Encounter (INDEPENDENT_AMBULATORY_CARE_PROVIDER_SITE_OTHER): Payer: Self-pay

## 2018-10-06 LAB — NOVEL CORONAVIRUS, NAA: SARS-CoV-2, NAA: NOT DETECTED

## 2018-10-07 ENCOUNTER — Encounter (INDEPENDENT_AMBULATORY_CARE_PROVIDER_SITE_OTHER): Payer: Self-pay

## 2018-10-08 ENCOUNTER — Encounter (INDEPENDENT_AMBULATORY_CARE_PROVIDER_SITE_OTHER): Payer: Self-pay

## 2018-11-25 ENCOUNTER — Encounter: Payer: Self-pay | Admitting: Family Medicine

## 2018-11-25 ENCOUNTER — Ambulatory Visit (INDEPENDENT_AMBULATORY_CARE_PROVIDER_SITE_OTHER): Payer: BC Managed Care – PPO | Admitting: Family Medicine

## 2018-11-25 ENCOUNTER — Other Ambulatory Visit: Payer: Self-pay

## 2018-11-25 VITALS — BP 120/70 | HR 87 | Temp 97.8°F | Resp 18 | Ht 62.0 in | Wt 115.0 lb

## 2018-11-25 DIAGNOSIS — J302 Other seasonal allergic rhinitis: Secondary | ICD-10-CM

## 2018-11-25 DIAGNOSIS — F329 Major depressive disorder, single episode, unspecified: Secondary | ICD-10-CM | POA: Diagnosis not present

## 2018-11-25 DIAGNOSIS — Z0000 Encounter for general adult medical examination without abnormal findings: Secondary | ICD-10-CM | POA: Diagnosis not present

## 2018-11-25 DIAGNOSIS — F32A Depression, unspecified: Secondary | ICD-10-CM

## 2018-11-25 LAB — COMPREHENSIVE METABOLIC PANEL
ALT: 17 U/L (ref 0–35)
AST: 19 U/L (ref 0–37)
Albumin: 4.3 g/dL (ref 3.5–5.2)
Alkaline Phosphatase: 54 U/L (ref 39–117)
BUN: 13 mg/dL (ref 6–23)
CO2: 27 mEq/L (ref 19–32)
Calcium: 9.3 mg/dL (ref 8.4–10.5)
Chloride: 103 mEq/L (ref 96–112)
Creatinine, Ser: 0.71 mg/dL (ref 0.40–1.20)
GFR: 86.04 mL/min (ref >60.00)
Glucose, Bld: 90 mg/dL (ref 70–99)
Potassium: 3.9 mEq/L (ref 3.5–5.1)
Sodium: 140 mEq/L (ref 135–145)
Total Bilirubin: 0.2 mg/dL (ref 0.2–1.2)
Total Protein: 6.8 g/dL (ref 6.0–8.3)

## 2018-11-25 LAB — CBC WITH DIFFERENTIAL/PLATELET
Basophils Absolute: 0 10*3/uL (ref 0.0–0.1)
Basophils Relative: 0.6 % (ref 0.0–3.0)
Eosinophils Absolute: 0 10*3/uL (ref 0.0–0.7)
Eosinophils Relative: 0.2 % (ref 0.0–5.0)
HCT: 42 % (ref 36.0–46.0)
Hemoglobin: 13.7 g/dL (ref 12.0–15.0)
Lymphocytes Relative: 11.9 % — ABNORMAL LOW (ref 12.0–46.0)
Lymphs Abs: 0.7 10*3/uL (ref 0.7–4.0)
MCHC: 32.5 g/dL (ref 30.0–36.0)
MCV: 86.1 fl (ref 78.0–100.0)
Monocytes Absolute: 0.2 10*3/uL (ref 0.1–1.0)
Monocytes Relative: 2.6 % — ABNORMAL LOW (ref 3.0–12.0)
Neutro Abs: 5.1 10*3/uL (ref 1.4–7.7)
Neutrophils Relative %: 84.7 % — ABNORMAL HIGH (ref 43.0–77.0)
Platelets: 259 10*3/uL (ref 150.0–400.0)
RBC: 4.87 Mil/uL (ref 3.87–5.11)
RDW: 13.2 % (ref 11.5–15.5)
WBC: 6 10*3/uL (ref 4.0–10.5)

## 2018-11-25 LAB — LIPID PANEL
Cholesterol: 178 mg/dL (ref 0–200)
HDL: 56 mg/dL (ref >39.00)
LDL Cholesterol: 105 mg/dL — ABNORMAL HIGH (ref 0–99)
NonHDL: 122.17
Total CHOL/HDL Ratio: 3
Triglycerides: 85 mg/dL (ref 0.0–149.0)
VLDL: 17 mg/dL (ref 0.0–40.0)

## 2018-11-25 LAB — TSH: TSH: 0.94 u[IU]/mL (ref 0.35–4.50)

## 2018-11-25 MED ORDER — CETIRIZINE HCL 10 MG PO TABS: 10.0000 mg | ORAL_TABLET | Freq: Every day | ORAL | 11 refills | Status: DC

## 2018-11-25 MED ORDER — BUPROPION HCL ER (XL) 300 MG PO TB24: 300.0000 mg | ORAL_TABLET | Freq: Every day | ORAL | 3 refills | Status: DC

## 2018-11-25 NOTE — Progress Notes (Signed)
Subjective:     Emily Kirby is a 53 y.o. female and is here for a comprehensive physical exam. The patient reports no problems.  Social History   Socioeconomic History  . Marital status: Married    Spouse name: Not on file  . Number of children: Not on file  . Years of education: Not on file  . Highest education level: Not on file  Occupational History    Employer: [employer]    Comment: court interpreter  Social Needs  . Financial resource strain: Not on file  . Food insecurity    Worry: Not on file    Inability: Not on file  . Transportation needs    Medical: Not on file    Non-medical: Not on file  Tobacco Use  . Smoking status: Never Smoker  . Smokeless tobacco: Never Used  Substance and Sexual Activity  . Alcohol use: Yes    Alcohol/week: 0.0 standard drinks    Comment:  3 X PER WEEK, HAVE NOT HAD ANY IN THE LAST 1 1/2  . Drug use: No  . Sexual activity: Yes    Partners: Male  Lifestyle  . Physical activity    Days per week: Not on file    Minutes per session: Not on file  . Stress: Not on file  Relationships  . Social Herbalist on phone: Not on file    Gets together: Not on file    Attends religious service: Not on file    Active member of club or organization: Not on file    Attends meetings of clubs or organizations: Not on file    Relationship status: Not on file  . Intimate partner violence    Fear of current or ex partner: Not on file    Emotionally abused: Not on file    Physically abused: Not on file    Forced sexual activity: Not on file  Other Topics Concern  . Not on file  Social History Narrative  . Not on file   Health Maintenance  Topic Date Due  . HIV Screening  09/11/1980  . MAMMOGRAM  10/27/2016  . PAP SMEAR-Modifier  10/09/2018  . TETANUS/TDAP  10/10/2023  . COLONOSCOPY  06/30/2026  . INFLUENZA VACCINE  Completed    The following portions of the patient's history were reviewed and updated as  appropriate:  She  has a past medical history of AC (acromioclavicular) joint bone spurs, Allergy, Chicken pox, Kidney stones, and Uterine fibroid. She does not have any pertinent problems on file. She  has a past surgical history that includes Liposuction; Cesarean section; and Dilation and curettage of uterus. Her family history includes COPD in her father; Cancer in her father; Hearing loss in her father; Heart disease in her father; Hypertension in her mother; Prostate cancer in her father. She  reports that she has never smoked. She has never used smokeless tobacco. She reports current alcohol use. She reports that she does not use drugs. She has a current medication list which includes the following prescription(s): bupropion, fluticasone, moxifloxacin, neomycin-polymyxin-hc (ophth), norethin ace-eth estrad-fe, and cetirizine. Current Outpatient Medications on File Prior to Visit  Medication Sig Dispense Refill  . fluticasone (FLONASE) 50 MCG/ACT nasal spray SPRAY 2 SPRAYS INTO EACH NOSTRIL EVERY DAY 16 mL 2  . moxifloxacin (VIGAMOX) 0.5 % ophthalmic solution Place 1 drop into both eyes 3 (three) times daily. 3 mL 0  . NEOMYCIN-POLYMYXIN-HC, OPHTH, SUSP 1 drops  to each eye 3 times daily for 7 days. 7.5 mL 0  . Norethin Ace-Eth Estrad-FE (TAYTULLA) 1-20 MG-MCG(24) CAPS Take 1 capsule by mouth daily.     No current facility-administered medications on file prior to visit.    She is allergic to codeine and pseudoephedrine..  Review of Systems Review of Systems  Constitutional: Negative for activity change, appetite change and fatigue.  HENT: Negative for hearing loss, congestion, tinnitus and ear discharge.  dentist q29m Eyes: Negative for visual disturbance (see optho q1y -- vision corrected to 20/20 with glasses).  Respiratory: Negative for cough, chest tightness and shortness of breath.   Cardiovascular: Negative for chest pain, palpitations and leg swelling.  Gastrointestinal: Negative  for abdominal pain, diarrhea, constipation and abdominal distention.  Genitourinary: Negative for urgency, frequency, decreased urine volume and difficulty urinating.  Musculoskeletal: Negative for back pain, arthralgias and gait problem.  Skin: Negative for color change, pallor and rash.  Neurological: Negative for dizziness, light-headedness, numbness and headaches.  Hematological: Negative for adenopathy. Does not bruise/bleed easily.  Psychiatric/Behavioral: Negative for suicidal ideas, confusion, sleep disturbance, self-injury, dysphoric mood, decreased concentration and agitation.       Objective:    BP 120/70 (BP Location: Left Arm, Patient Position: Sitting, Cuff Size: Normal)   Pulse 87   Temp 97.8 F (36.6 C) (Temporal)   Resp 18   Ht 5\' 2"  (1.575 m)   Wt 115 lb (52.2 kg)   SpO2 99%   BMI 21.03 kg/m  General appearance: alert, cooperative, appears stated age and no distress Head: Normocephalic, without obvious abnormality, atraumatic Eyes: conjunctivae/corneas clear. PERRL, EOM's intact. Fundi benign. Ears: normal TM's and external ear canals both ears Nose: Nares normal. Septum midline. Mucosa normal. No drainage or sinus tenderness. Throat: lips, mucosa, and tongue normal; teeth and gums normal Neck: no adenopathy, no carotid bruit, no JVD, supple, symmetrical, trachea midline and thyroid not enlarged, symmetric, no tenderness/mass/nodules Back: symmetric, no curvature. ROM normal. No CVA tenderness. Lungs: clear to auscultation bilaterally Breasts: gyn Heart: regular rate and rhythm, S1, S2 normal, no murmur, click, rub or gallop Abdomen: soft, non-tender; bowel sounds normal; no masses,  no organomegaly Pelvic: deferred--gyn Extremities: extremities normal, atraumatic, no cyanosis or edema Pulses: 2+ and symmetric Skin: Skin color, texture, turgor normal. No rashes or lesions Lymph nodes: Cervical, supraclavicular, and axillary nodes normal. Neurologic: Alert  and oriented X 3, normal strength and tone. Normal symmetric reflexes. Normal coordination and gait    Assessment:    Healthy female exam.      Plan:    ghm utd  Check labs See After Visit Summary for Counseling Recommendations   Pt received flu shot from pharmacy

## 2018-11-25 NOTE — Patient Instructions (Signed)

## 2018-12-31 ENCOUNTER — Other Ambulatory Visit: Payer: Self-pay | Admitting: Family Medicine

## 2019-09-12 ENCOUNTER — Encounter: Payer: Self-pay | Admitting: Family Medicine

## 2019-09-12 ENCOUNTER — Ambulatory Visit: Payer: BC Managed Care – PPO | Admitting: Family Medicine

## 2019-09-12 ENCOUNTER — Other Ambulatory Visit: Payer: Self-pay

## 2019-09-12 VITALS — BP 128/68 | HR 97 | Temp 98.2°F | Ht 62.0 in | Wt 117.0 lb

## 2019-09-12 DIAGNOSIS — M25531 Pain in right wrist: Secondary | ICD-10-CM

## 2019-09-12 MED ORDER — MELOXICAM 15 MG PO TABS: 15.0000 mg | ORAL_TABLET | Freq: Every day | ORAL | 0 refills | Status: DC

## 2019-09-12 NOTE — Patient Instructions (Addendum)
Wear the splint at night and during aggravating activities.   Ice/cold pack over area for 10-15 min twice daily.  Heat (pad or rice pillow in microwave) over affected area, 10-15 minutes twice daily.   Send me a message in 2 weeks if no better, sooner if worsening.   Wrist and Forearm Exercises Do exercises exactly as told by your health care provider and adjust them as directed. It is normal to feel mild stretching, pulling, tightness, or discomfort as you do these exercises, but you should stop right away if you feel sudden pain or your pain gets worse.   RANGE OF MOTION EXERCISES These exercises warm up your muscles and joints and improve the movement and flexibility of your injured wrist and forearm. These exercises also help to relieve pain, numbness, and tingling. These exercises are done using the muscles in your injured wrist and forearm. Exercise A: Wrist Flexion, Active 1. With your fingers relaxed, bend your wrist forward as far as you can. 2. Hold this position for 30 seconds. Repeat 2 times. Complete this exercise 3 times per week. Exercise B: Wrist Extension, Active 1. With your fingers relaxed, bend your wrist backward as far as you can. 2. Hold this position for 30 seconds. Repeat 2 times. Complete this exercise 3 times per week. Exercise C: Supination, Active  1. Stand or sit with your arms at your sides. 2. Bend your left / right elbow to an "L" shape (90 degrees). 3. Turn your palm upward until you feel a gentle stretch on the inside of your forearm. 4. Hold this position for 30 seconds. 5. Slowly return your palm to the starting position. Repeat 2 times. Complete this exercise 3 times per week. Exercise D: Pronation, Active  1. Stand or sit with your arms at your sides. 2. Bend your left / right elbow to an "L" shape (90 degrees). 3. Turn your palm downward until you feel a gentle stretch on the top of your forearm. 4. Hold this position for 30  seconds. 5. Slowly return your palm to the starting position. Repeat 2 times. Complete this exercise once a day.  STRETCHING EXERCISES These exercises warm up your muscles and joints and improve the movement and flexibility of your injured wrist and forearm. These exercises also help to relieve pain, numbness, and tingling. These exercises are done using your healthy wrist and forearm to help stretch the muscles in your injured wrist and forearm. Exercise E: Wrist Flexion, Passive  1. Extend your left / right arm in front of you, relax your wrist, and point your fingers downward. 2. Gently push on the back of your hand. Stop when you feel a gentle stretch on the top of your forearm. 3. Hold this position for 30 seconds. Repeat 2 times. Complete this exercise 3 times per week. Exercise F: Wrist Extension, Passive  1. Extend your left / right arm in front of you and turn your palm upward. 2. Gently pull your palm and fingertips back so your fingers point downward. You should feel a gentle stretch on the palm-side of your forearm. 3. Hold this position for 30 seconds. Repeat 2 times. Complete this exercise 3 times per week. Exercise G: Forearm Rotation, Supination, Passive 1. Sit with your left / right elbow bent to an "L" shape (90 degrees) with your forearm resting on a table. 2. Keeping your upper body and shoulder still, use your other hand to rotate your forearm palm-up until you feel a gentle to moderate  stretch. 3. Hold this position for 30 seconds. 4. Slowly release the stretch and return to the starting position. Repeat 2 times. Complete this exercise 3 times per week. Exercise H: Forearm Rotation, Pronation, Passive 1. Sit with your left / right elbow bent to an "L" shape (90 degrees) with your forearm resting on a table. 2. Keeping your upper body and shoulder still, use your other hand to rotate your forearm palm-down until you feel a gentle to moderate stretch. 3. Hold this  position for 30 seconds. 4. Slowly release the stretch and return to the starting position. Repeat 2 times. Complete this exercise 3 times per week.  STRENGTHENING EXERCISES These exercises build strength and endurance in your wrist and forearm. Endurance is the ability to use your muscles for a long time, even after they get tired. Exercise I: Wrist Flexors  1. Sit with your left / right forearm supported on a table and your hand resting palm-up over the edge of the table. Your elbow should be bent to an "L" shape (about 90 degrees) and be below the level of your shoulder. 2. Hold a 3-5 lb weight in your left / right hand. Or, hold a rubber exercise band or tube in both hands, keeping your hands at the same level and hip distance apart. There should be a slight tension in the exercise band or tube. 3. Slowly curl your hand up toward your forearm. 4. Hold this position for 3 seconds. 5. Slowly lower your hand back to the starting position. Repeat 2 times. Complete this exercise 3 times per week. Exercise J: Wrist Extensors  1. Sit with your left / right forearm supported on a table and your hand resting palm-down over the edge of the table. Your elbow should be bent to an "L" shape (about 90 degrees) and be below the level of your shoulder. 2. Hold a 3-5 lb weight in your left / right hand. Or, hold a rubber exercise band or tube in both hands, keeping your hands at the same level and hip distance apart. There should be a slight tension in the exercise band or tube. 3. Slowly curl your hand up toward your forearm. 4. Hold this position for 3 seconds. 5. Slowly lower your hand back to the starting position. Repeat 2 times. Complete this exercise 3 times per week. Exercise K: Forearm Rotation, Supination  1. Sit with your left / right forearm supported on a table and your hand resting palm-down. Your elbow should be at your side, bent to an "L" shape (about 90 degrees), and below the level of  your shoulder. Keep your wrist stable and in a neutral position throughout the exercise. 2. Gently hold a lightweight hammer with your left / right hand. 3. Without moving your elbow or wrist, slowly rotate your palm upward to a thumbs-up position. 4. Hold this position for 3 seconds. 5. Slowly return your forearm to the starting position. Repeat 2 times. Complete this exercise 3 times per week. Exercise L: Forearm Rotation, Pronation  1. Sit with your left / right forearm supported on a table and your hand resting palm-up. Your elbow should be at your side, bent to an "L" shape (about 90 degrees), and below the level of your shoulder. Keep your wrist stable. Do not allow it to move backward or forward during the exercise. 2. Gently hold a lightweight hammer with your left / right hand. 3. Without moving your elbow or wrist, slowly rotate your palm and hand  upward to a thumbs-up position. 4. Hold this position for 3 seconds. 5. Slowly return your forearm to the starting position. Repeat 2 times. Complete this exercise 3 times per week. Exercise M: Grip Strengthening  1. Hold one of these items in your left / right hand: play dough, therapy putty, a dense sponge, a stress ball, or a large, rolled sock. 2. Squeeze as hard as you can without increasing pain. 3. Hold this position for 5 seconds. 4. Slowly release your grip. Repeat 2 times. Complete this exercise 3 times per week.  This information is not intended to replace advice given to you by your health care provider. Make sure you discuss any questions you have with your health care provider. Document Released: 01/07/2005 Document Revised: 11/18/2015 Document Reviewed: 11/18/2014 Elsevier Interactive Patient Education  [employer].

## 2019-09-12 NOTE — Progress Notes (Signed)
Musculoskeletal Exam  Patient: Emily Kirby DOB: January 16, 1966  DOS: 09/12/2019  SUBJECTIVE:  Chief Complaint:   Chief Complaint  Patient presents with  . Wrist Pain    right    Emily Kirby is a 54 y.o.  female for evaluation and treatment of R wrist pain.   Onset:  10 days ago. No inj or change in activity. She does play a lot of tennis Location: circumferentially around wrist Character:  stabbing  Progression of issue:  has worsened Associated symptoms: no redness, bruising, swelling, decreased ROm Treatment: to date has been- a splint at night, ibuprofen.   Neurovascular symptoms: no  Past Medical History:  Diagnosis Date  . AC (acromioclavicular) joint bone spurs   . Allergy    SEASONAL  . Chicken pox   . Kidney stones   . Uterine fibroid     Objective: VITAL SIGNS: BP 128/68 (BP Location: Left Arm, Patient Position: Sitting, Cuff Size: Normal)   Pulse 97   Temp 98.2 F (36.8 C) (Temporal)   Ht 5\' 2"  (1.575 m)   Wt 117 lb (53.1 kg)   SpO2 94%   BMI 21.40 kg/m  Constitutional: Well formed, well developed. No acute distress. Cardiovascular: Brisk cap refill Thorax & Lungs: No accessory muscle use Musculoskeletal: R wrist.   Normal active range of motion: yes.   Normal passive range of motion: yes Tenderness to palpation: yes- medial dorsum of wrist over muscle belly, no bony ttp, no pain with squeezing, no pain over carpal rows or prox MC's Deformity: no Ecchymosis: no Neurologic: Normal sensory function. Marland Kitchen Psychiatric: Normal mood. Age appropriate judgment and insight. Alert & oriented x 3.    Assessment:  Right wrist pain - Plan: meloxicam (MOBIC) 15 MG tablet  Plan: Mobic, ice, heat, wear splint at night and during aggravating activities. Stretches/exercises. Send message if no improvement in next 2 weeks and will refer to [facility] for possible imaging.  F/u prn. The patient voiced understanding and agreement to the plan.   Maxwell,  DO 09/12/19  4:15 PM

## 2019-09-18 ENCOUNTER — Other Ambulatory Visit: Payer: Self-pay | Admitting: Family Medicine

## 2019-09-18 DIAGNOSIS — M25531 Pain in right wrist: Secondary | ICD-10-CM

## 2019-09-19 ENCOUNTER — Encounter: Payer: Self-pay | Admitting: Family Medicine

## 2019-09-19 ENCOUNTER — Other Ambulatory Visit: Payer: Self-pay

## 2019-09-19 ENCOUNTER — Ambulatory Visit: Payer: Self-pay

## 2019-09-19 ENCOUNTER — Ambulatory Visit (INDEPENDENT_AMBULATORY_CARE_PROVIDER_SITE_OTHER): Payer: BC Managed Care – PPO | Admitting: Family Medicine

## 2019-09-19 VITALS — Ht 62.0 in | Wt 116.0 lb

## 2019-09-19 DIAGNOSIS — M24131 Other articular cartilage disorders, right wrist: Secondary | ICD-10-CM

## 2019-09-19 DIAGNOSIS — M25531 Pain in right wrist: Secondary | ICD-10-CM

## 2019-09-19 MED ORDER — PREDNISONE 5 MG PO TABS
ORAL_TABLET | ORAL | 0 refills | Status: DC
Start: 2019-09-19 — End: 2019-11-14

## 2019-09-19 NOTE — Assessment & Plan Note (Signed)
Symptoms seem more consistent with a TFCC.  Does not appear to be a tendinitis or ulnar impaction. -Counseled on home exercise therapy and supportive care. -Prednisone. -Counseled on compression. -Could consider injection or physical therapy.

## 2019-09-19 NOTE — Progress Notes (Signed)
Emily Kirby - 54 y.o. female MRN [id number]  Date of birth: May 01, 1965  SUBJECTIVE:  Including CC & ROS.  Chief Complaint  Patient presents with  . Wrist Pain    right x 2 weeks    Emily Kirby is a 54 y.o. female that is presenting with right wrist pain.  The pain has been ongoing for a few months.  It is occurring over the ulnar aspect of the wrist.  It is worse with ulnar deviation.  No specific injury or inciting event.  Has been doing CrossFit in January.  Loading the wrist seemed to make the pain worse.  She does play tennis on a regular basis.   Review of Systems See HPI   HISTORY: Past Medical, Surgical, Social, and Family History Reviewed & Updated per EMR.   Pertinent Historical Findings include:  Past Medical History:  Diagnosis Date  . AC (acromioclavicular) joint bone spurs   . Allergy    SEASONAL  . Chicken pox   . Kidney stones   . Uterine fibroid     Past Surgical History:  Procedure Laterality Date  . CESAREAN SECTION    . DILATION AND CURETTAGE OF UTERUS    . LIPOSUCTION      Family History  Problem Relation Age of Onset  . Cancer Father        prostate  . Hearing loss Father   . COPD Father   . Heart disease Father        MI  . Prostate cancer Father   . Hypertension Mother   . Colon cancer Neg Hx     Social History   Socioeconomic History  . Marital status: Married    Spouse name: Not on file  . Number of children: Not on file  . Years of education: Not on file  . Highest education level: Not on file  Occupational History    Employer: [employer]    Comment: court interpreter  Tobacco Use  . Smoking status: Never Smoker  . Smokeless tobacco: Never Used  Substance and Sexual Activity  . Alcohol use: Yes    Alcohol/week: 0.0 standard drinks    Comment:  3 X PER WEEK, HAVE NOT HAD ANY IN THE LAST 1 1/2  . Drug use: No  . Sexual activity: Yes    Partners: Male  Other Topics Concern  . Not on file  Social  History Narrative  . Not on file   Social Determinants of Health   Financial Resource Strain:   . Difficulty of Paying Living Expenses:   Food Insecurity:   . Worried About Charity fundraiser in the Last Year:   . Arboriculturist in the Last Year:   Transportation Needs:   . Film/video editor (Medical):   Marland Kitchen Lack of Transportation (Non-Medical):   Physical Activity:   . Days of Exercise per Week:   . Minutes of Exercise per Session:   Stress:   . Feeling of Stress :   Social Connections:   . Frequency of Communication with Friends and Family:   . Frequency of Social Gatherings with Friends and Family:   . Attends Religious Services:   . Active Member of Clubs or Organizations:   . Attends Archivist Meetings:   Marland Kitchen Marital Status:   Intimate Partner Violence:   . Fear of Current or Ex-Partner:   . Emotionally Abused:   Marland Kitchen Physically Abused:   . Sexually  Abused:      PHYSICAL EXAM:  VS: Ht 5\' 2"  (1.575 m)   Wt 116 lb (52.6 kg)   BMI 21.22 kg/m  Physical Exam Gen: NAD, alert, cooperative with exam, well-appearing MSK:  Right wrist: Tenderness palpation over the TFCC. Normal range of motion. Normal grip strength. Normal strength resistance. Negative clunk test. Pain with ulnar deviation. Neurovascularly intact  Limited ultrasound: Right wrist:  No changes of the distal ulna. No changes of the distal radius. Appears to be a small effusion in the carpal joints. Normal-appearing ECU and insertion. Mild degenerative changes of the TFCC with no hyperemia.  Summary: Findings would likely be associated with degenerative changes of the TFCC.  Ultrasound and interpretation by Clearance Coots, MD    ASSESSMENT & PLAN:   Degenerative tear of triangular fibrocartilage complex (TFCC) of right wrist Symptoms seem more consistent with a TFCC.  Does not appear to be a tendinitis or ulnar impaction. -Counseled on home exercise therapy and supportive  care. -Prednisone. -Counseled on compression. -Could consider injection or physical therapy.

## 2019-09-19 NOTE — Patient Instructions (Addendum)
Nice to meet you Please try the exercises  Please try ice  Please try compression  Please try hitting a flatter shot in tennis   Please try the pennsaid after the prednisone  Please send me a message in [place] with any questions or updates.  Please see me back in 4 weeks .   --Dr. Raeford Razor

## 2019-10-05 ENCOUNTER — Other Ambulatory Visit: Payer: Self-pay | Admitting: Family Medicine

## 2019-10-05 DIAGNOSIS — M25531 Pain in right wrist: Secondary | ICD-10-CM

## 2019-10-18 ENCOUNTER — Ambulatory Visit: Payer: BC Managed Care – PPO | Admitting: Family Medicine

## 2019-11-14 ENCOUNTER — Other Ambulatory Visit: Payer: Self-pay

## 2019-11-14 ENCOUNTER — Ambulatory Visit: Payer: BC Managed Care – PPO | Admitting: Internal Medicine

## 2019-11-14 ENCOUNTER — Encounter: Payer: Self-pay | Admitting: Internal Medicine

## 2019-11-14 VITALS — BP 125/82 | HR 90 | Temp 98.4°F | Resp 16 | Ht 62.0 in | Wt 116.5 lb

## 2019-11-14 DIAGNOSIS — R42 Dizziness and giddiness: Secondary | ICD-10-CM | POA: Diagnosis not present

## 2019-11-14 DIAGNOSIS — L309 Dermatitis, unspecified: Secondary | ICD-10-CM | POA: Diagnosis not present

## 2019-11-14 DIAGNOSIS — R000 Tachycardia, unspecified: Secondary | ICD-10-CM | POA: Diagnosis not present

## 2019-11-14 MED ORDER — HYDROCORTISONE 2.5 % EX CREA
TOPICAL_CREAM | Freq: Two times a day (BID) | CUTANEOUS | 0 refills | Status: AC
Start: 2019-11-14 — End: 2020-11-13

## 2019-11-14 NOTE — Progress Notes (Signed)
Subjective:    Patient ID: Emily Kirby, female    DOB: 1965-09-01, 54 y.o.   MRN: [id number]  DOS:  11/14/2019 Type of visit - description: Acute 2 weeks history of dizziness: Symptoms are triggered by lying down, getting up or bending. Is not taking any new medicines except that she tried pseudoephedrine for few days to help with dizziness but that did not help.  She is also concerned about skin problem at the tip of the fingers: The skin gets dry and crackles.  Review of Systems No chest pain, difficulty breathing or palpitations No head injury Had a mild headache yesterday for few hours otherwise no headaches No diplopia slurred speech or motor deficits  Past Medical History:  Diagnosis Date  . AC (acromioclavicular) joint bone spurs   . Allergy    SEASONAL  . Chicken pox   . Kidney stones   . Uterine fibroid     Past Surgical History:  Procedure Laterality Date  . CESAREAN SECTION    . DILATION AND CURETTAGE OF UTERUS    . LIPOSUCTION      Allergies as of 11/14/2019      Reactions   Codeine Other (See Comments)   Stomach upset   Pseudoephedrine Other (See Comments)   Tingling all over body      Medication List       Accurate as of November 14, 2019 10:25 AM. If you have any questions, ask your nurse or doctor.        buPROPion 300 MG 24 hr tablet Commonly known as: WELLBUTRIN XL Take 1 tablet (300 mg total) by mouth daily.   cetirizine 10 MG tablet Commonly known as: ZYRTEC Take 10 mg by mouth daily.   fluticasone 50 MCG/ACT nasal spray Commonly known as: FLONASE Place 1-2 sprays into both nostrils daily.   meloxicam 15 MG tablet Commonly known as: MOBIC Take 1 tablet (15 mg total) by mouth daily.   predniSONE 5 MG tablet Commonly known as: DELTASONE Take 6 pills for first day, 5 pills second day, 4 pills third day, 3 pills fourth day, 2 pills the fifth day, and 1 pill sixth day.   Taytulla 1-20 MG-MCG(24) Caps Generic drug: Norethin Ace-Eth  Estrad-FE Take 1 capsule by mouth daily.          Objective:   Physical Exam BP 125/82 (BP Location: Left Arm, Patient Position: Sitting, Cuff Size: Small)   Pulse 90   Temp 98.4 F (36.9 C) (Oral)   Resp 16   Ht 5\' 2"  (1.575 m)   Wt 116 lb 8 oz (52.8 kg)   SpO2 97%   BMI 21.31 kg/m  General:   Well developed, NAD, BMI noted. HEENT:  Normocephalic . Face symmetric, atraumatic.  Not pale normal.  TMs normal Neck: No thyromegaly, normal carotid pulses. Lungs:  CTA B Normal respiratory effort, no intercostal retractions, no accessory muscle use. Heart: RRR,  no murmur.  Lower extremities: no pretibial edema bilaterally  Skin: Some fingers with the distal skin dry, crackled.  No erythema. Neurologic:  alert & oriented X3.  Speech normal, gait appropriate for age and unassisted . DTRs  symmetric Psych--  Cognition and judgment appear intact.  Cooperative with normal attention span and concentration.  Behavior appropriate. No anxious or depressed appearing.      Assessment    54 year old female, PMH includes depression, on BCPs, presents with:  Dizziness: As described above, most likely a peripheral issue.   Orthostatic vital signs:  She become tachycardic with standing. EKG today sinus rhythm, no acute changes, no old EKGs. Suspect this is a peripheral issue, recommend fall precautions, good hydration and observation for a couple of weeks. Will check CMP, CBC, TSH. If severe symptoms: Go to the ER. Dermatitis: At the tip of the fingers, eczema?  Trial w/ hydrocortisone 2.5%'s.   This visit occurred during the SARS-CoV-2 public health emergency.  Safety protocols were in place, including screening questions prior to the visit, additional usage of staff PPE, and extensive cleaning of exam room while observing appropriate contact time as indicated for disinfecting solutions.

## 2019-11-14 NOTE — Progress Notes (Signed)
Pre visit review using our clinic review tool, if applicable. No additional management support is needed unless otherwise documented below in the visit note. 

## 2019-11-14 NOTE — Patient Instructions (Signed)
Rest, drink plenty of fluids.  If you have any severe or unusual headache or dizziness: Call or go to the ER  Call if not gradually better in the next 2 to 3 weeks   GO TO THE LAB : Get the blood work

## 2019-11-15 LAB — COMPREHENSIVE METABOLIC PANEL
AG Ratio: 1.7 (calc) (ref 1.0–2.5)
ALT: 16 U/L (ref 6–29)
AST: 18 U/L (ref 10–35)
Albumin: 4.2 g/dL (ref 3.6–5.1)
Alkaline phosphatase (APISO): 49 U/L (ref 37–153)
BUN: 15 mg/dL (ref 7–25)
CO2: 31 mmol/L (ref 20–32)
Calcium: 9.7 mg/dL (ref 8.6–10.4)
Chloride: 105 mmol/L (ref 98–110)
Creat: 0.86 mg/dL (ref 0.50–1.05)
Globulin: 2.5 g/dL (calc) (ref 1.9–3.7)
Glucose, Bld: 87 mg/dL (ref 65–99)
Potassium: 5.1 mmol/L (ref 3.5–5.3)
Sodium: 140 mmol/L (ref 135–146)
Total Bilirubin: 0.4 mg/dL (ref 0.2–1.2)
Total Protein: 6.7 g/dL (ref 6.1–8.1)

## 2019-11-15 LAB — CBC WITH DIFFERENTIAL/PLATELET
Absolute Monocytes: 336 cells/uL (ref 200–950)
Basophils Absolute: 29 cells/uL (ref 0–200)
Basophils Relative: 0.7 %
Eosinophils Absolute: 78 cells/uL (ref 15–500)
Eosinophils Relative: 1.9 %
HCT: 41.6 % (ref 35.0–45.0)
Hemoglobin: 13.8 g/dL (ref 11.7–15.5)
Lymphs Abs: 1111 cells/uL (ref 850–3900)
MCH: 28.4 pg (ref 27.0–33.0)
MCHC: 33.2 g/dL (ref 32.0–36.0)
MCV: 85.6 fL (ref 80.0–100.0)
MPV: 9.3 fL (ref 7.5–12.5)
Monocytes Relative: 8.2 %
Neutro Abs: 2546 cells/uL (ref 1500–7800)
Neutrophils Relative %: 62.1 %
Platelets: 298 10*3/uL (ref 140–400)
RBC: 4.86 10*6/uL (ref 3.80–5.10)
RDW: 12.5 % (ref 11.0–15.0)
Total Lymphocyte: 27.1 %
WBC: 4.1 10*3/uL (ref 3.8–10.8)

## 2019-11-15 LAB — TSH: TSH: 2.26 mIU/L

## 2019-12-01 ENCOUNTER — Ambulatory Visit (INDEPENDENT_AMBULATORY_CARE_PROVIDER_SITE_OTHER): Payer: BC Managed Care – PPO | Admitting: Family Medicine

## 2019-12-01 ENCOUNTER — Encounter: Payer: Self-pay | Admitting: Family Medicine

## 2019-12-01 ENCOUNTER — Other Ambulatory Visit: Payer: Self-pay

## 2019-12-01 ENCOUNTER — Other Ambulatory Visit: Payer: Self-pay | Admitting: Family Medicine

## 2019-12-01 VITALS — BP 126/80 | HR 86 | Temp 98.5°F | Resp 18 | Ht 62.0 in | Wt 117.2 lb

## 2019-12-01 DIAGNOSIS — Z0000 Encounter for general adult medical examination without abnormal findings: Secondary | ICD-10-CM | POA: Diagnosis not present

## 2019-12-01 DIAGNOSIS — Z1159 Encounter for screening for other viral diseases: Secondary | ICD-10-CM | POA: Diagnosis not present

## 2019-12-01 DIAGNOSIS — F329 Major depressive disorder, single episode, unspecified: Secondary | ICD-10-CM

## 2019-12-01 DIAGNOSIS — F32A Depression, unspecified: Secondary | ICD-10-CM

## 2019-12-01 DIAGNOSIS — Z23 Encounter for immunization: Secondary | ICD-10-CM | POA: Diagnosis not present

## 2019-12-01 MED ORDER — BUPROPION HCL ER (XL) 300 MG PO TB24: 300.0000 mg | ORAL_TABLET | Freq: Every day | ORAL | 3 refills | Status: DC

## 2019-12-01 NOTE — Progress Notes (Signed)
Subjective:     Emily Kirby is a 54 y.o. female and is here for a comprehensive physical exam. The patient reports problems - R wrist pain--- she saw sport med but pain is only a little better .  Social History   Socioeconomic History  . Marital status: Married    Spouse name: Not on file  . Number of children: Not on file  . Years of education: Not on file  . Highest education level: Not on file  Occupational History    Employer: [employer]    Comment: court interpreter  Tobacco Use  . Smoking status: Never Smoker  . Smokeless tobacco: Never Used  Substance and Sexual Activity  . Alcohol use: Yes    Alcohol/week: 0.0 standard drinks    Comment:  3 X PER WEEK, HAVE NOT HAD ANY IN THE LAST 1 1/2  . Drug use: No  . Sexual activity: Yes    Partners: Male  Other Topics Concern  . Not on file  Social History Narrative  . Not on file   Social Determinants of Health   Financial Resource Strain:   . Difficulty of Paying Living Expenses: Not on file  Food Insecurity:   . Worried About Charity fundraiser in the Last Year: Not on file  . Ran Out of Food in the Last Year: Not on file  Transportation Needs:   . Lack of Transportation (Medical): Not on file  . Lack of Transportation (Non-Medical): Not on file  Physical Activity:   . Days of Exercise per Week: Not on file  . Minutes of Exercise per Session: Not on file  Stress:   . Feeling of Stress : Not on file  Social Connections:   . Frequency of Communication with Friends and Family: Not on file  . Frequency of Social Gatherings with Friends and Family: Not on file  . Attends Religious Services: Not on file  . Active Member of Clubs or Organizations: Not on file  . Attends Archivist Meetings: Not on file  . Marital Status: Not on file  Intimate Partner Violence:   . Fear of Current or Ex-Partner: Not on file  . Emotionally Abused: Not on file  . Physically Abused: Not on file  .  Sexually Abused: Not on file   Health Maintenance  Topic Date Due  . Hepatitis C Screening  Never done  . HIV Screening  Never done  . MAMMOGRAM  10/27/2016  . PAP SMEAR-Modifier  10/09/2018  . TETANUS/TDAP  10/10/2023  . COLONOSCOPY  06/30/2026  . INFLUENZA VACCINE  Completed  . COVID-19 Vaccine  Completed    The following portions of the patient's history were reviewed and updated as appropriate:  She  has a past medical history of AC (acromioclavicular) joint bone spurs, Allergy, Chicken pox, Kidney stones, and Uterine fibroid. She does not have any pertinent problems on file. She  has a past surgical history that includes Liposuction; Cesarean section; and Dilation and curettage of uterus. Her family history includes COPD in her father; Cancer in her father; Hearing loss in her father; Heart disease in her father; Hypertension in her mother; Prostate cancer in her father. She  reports that she has never smoked. She has never used smokeless tobacco. She reports current alcohol use. She reports that she does not use drugs. She has a current medication list which includes the following prescription(s): bupropion, cetirizine, hydrocortisone, norethin ace-eth estrad-fe, and fluticasone. Current Outpatient  Medications on File Prior to Visit  Medication Sig Dispense Refill  . cetirizine (ZYRTEC) 10 MG tablet Take 10 mg by mouth daily.    . hydrocortisone 2.5 % cream Apply topically 2 (two) times daily. 30 g 0  . Norethin Ace-Eth Estrad-FE (TAYTULLA) 1-20 MG-MCG(24) CAPS Take 1 capsule by mouth daily.    . fluticasone (FLONASE) 50 MCG/ACT nasal spray Place 1-2 sprays into both nostrils daily. (Patient not taking: Reported on 12/01/2019)     No current facility-administered medications on file prior to visit.   She is allergic to codeine and pseudoephedrine..  Review of Systems Review of Systems  Constitutional: Negative for activity change, appetite change and fatigue.  HENT: Negative  for hearing loss, congestion, tinnitus and ear discharge.  dentist q38m Eyes: Negative for visual disturbance (see optho q1y -- vision corrected to 20/20 with glasses).  Respiratory: Negative for cough, chest tightness and shortness of breath.   Cardiovascular: Negative for chest pain, palpitations and leg swelling.  Gastrointestinal: Negative for abdominal pain, diarrhea, constipation and abdominal distention.  Genitourinary: Negative for urgency, frequency, decreased urine volume and difficulty urinating.  Musculoskeletal: Negative for back pain, arthralgias and gait problem.  Skin: Negative for color change, pallor and rash.  Neurological: Negative for dizziness, light-headedness, numbness and headaches.  Hematological: Negative for adenopathy. Does not bruise/bleed easily.  Psychiatric/Behavioral: Negative for suicidal ideas, confusion, sleep disturbance, self-injury, dysphoric mood, decreased concentration and agitation.      Objective:    BP 126/80 (BP Location: Right Arm, Patient Position: Sitting, Cuff Size: Normal)   Pulse 86   Temp 98.5 F (36.9 C) (Oral)   Resp 18   Ht 5\' 2"  (1.575 m)   Wt 117 lb 3.2 oz (53.2 kg)   SpO2 98%   BMI 21.44 kg/m  General appearance: alert, cooperative, appears stated age and no distress Head: Normocephalic, without obvious abnormality, atraumatic Eyes: negative findings: lids and lashes normal, conjunctivae and sclerae normal and pupils equal, round, reactive to light and accomodation Ears: normal TM's and external ear canals both ears Neck: no adenopathy, no carotid bruit, no JVD, supple, symmetrical, trachea midline and thyroid not enlarged, symmetric, no tenderness/mass/nodules Back: symmetric, no curvature. ROM normal. No CVA tenderness. Lungs: clear to auscultation bilaterally Breasts: gyn Heart: regular rate and rhythm, S1, S2 normal, no murmur, click, rub or gallop Abdomen: soft, non-tender; bowel sounds normal; no masses,  no  organomegaly Pelvic: deferred--gyn Extremities: extremities normal, atraumatic, no cyanosis or edema Pulses: 2+ and symmetric Skin: Skin color, texture, turgor normal. No rashes or lesions Lymph nodes: Cervical, supraclavicular, and axillary nodes normal. Neurologic: Alert and oriented X 3, normal strength and tone. Normal symmetric reflexes. Normal coordination and gait    Assessment:    Healthy female exam.      Plan:     See After Visit Summary for Counseling Recommendations   1. Need for influenza vaccination   - Flu Vaccine QUAD 36+ mos IM  2. Preventative health care See above  - Lipid panel  3. Need for hepatitis C screening test   - Hepatitis C antibody  4. Depression Stable con't wellbutrin  - buPROPion (WELLBUTRIN XL) 300 MG 24 hr tablet; Take 1 tablet (300 mg total) by mouth daily.  Dispense: 90 tablet; Refill: 3

## 2019-12-01 NOTE — Patient Instructions (Signed)
Preventive Care 54-54 Years Old, Female Preventive care refers to visits with your health care provider and lifestyle choices that can promote health and wellness. This includes:  A yearly physical exam. This may also be called an annual well check.  Regular dental visits and eye exams.  Immunizations.  Screening for certain conditions.  Healthy lifestyle choices, such as eating a healthy diet, getting regular exercise, not using drugs or products that contain nicotine and tobacco, and limiting alcohol use. What can I expect for my preventive care visit? Physical exam Your health care provider will check your:  Height and weight. This may be used to calculate body mass index (BMI), which tells if you are at a healthy weight.  Heart rate and blood pressure.  Skin for abnormal spots. Counseling Your health care provider may ask you questions about your:  Alcohol, tobacco, and drug use.  Emotional well-being.  Home and relationship well-being.  Sexual activity.  Eating habits.  Work and work Statistician.  Method of birth control.  Menstrual cycle.  Pregnancy history. What immunizations do I need?  Influenza (flu) vaccine  This is recommended every year. Tetanus, diphtheria, and pertussis (Tdap) vaccine  You may need a Td booster every 10 years. Varicella (chickenpox) vaccine  You may need this if you have not been vaccinated. Zoster (shingles) vaccine  You may need this after age 43. Measles, mumps, and rubella (MMR) vaccine  You may need at least one dose of MMR if you were born in 1957 or later. You may also need a second dose. Pneumococcal conjugate (PCV13) vaccine  You may need this if you have certain conditions and were not previously vaccinated. Pneumococcal polysaccharide (PPSV23) vaccine  You may need one or two doses if you smoke cigarettes or if you have certain conditions. Meningococcal conjugate (MenACWY) vaccine  You may need this if you  have certain conditions. Hepatitis A vaccine  You may need this if you have certain conditions or if you travel or work in places where you may be exposed to hepatitis A. Hepatitis B vaccine  You may need this if you have certain conditions or if you travel or work in places where you may be exposed to hepatitis B. Haemophilus influenzae type b (Hib) vaccine  You may need this if you have certain conditions. Human papillomavirus (HPV) vaccine  If recommended by your health care provider, you may need three doses over 6 months. You may receive vaccines as individual doses or as more than one vaccine together in one shot (combination vaccines). Talk with your health care provider about the risks and benefits of combination vaccines. What tests do I need? Blood tests  Lipid and cholesterol levels. These may be checked every 5 years, or more frequently if you are over 54 years old.  Hepatitis C test.  Hepatitis B test. Screening  Lung cancer screening. You may have this screening every year starting at age 54 if you have a 30-pack-year history of smoking and currently smoke or have quit within the past 15 years.  Colorectal cancer screening. All adults should have this screening starting at age 54 and continuing until age 77. Your health care provider may recommend screening at age 36 if you are at increased risk. You will have tests every 1-10 years, depending on your results and the type of screening test.  Diabetes screening. This is done by checking your blood sugar (glucose) after you have not eaten for a while (fasting). You may have this  done every 1-3 years.  Mammogram. This may be done every 1-2 years. Talk with your health care provider about when you should start having regular mammograms. This may depend on whether you have a family history of breast cancer.  BRCA-related cancer screening. This may be done if you have a family history of breast, ovarian, tubal, or peritoneal  cancers.  Pelvic exam and Pap test. This may be done every 3 years starting at age 54. Starting at age 54, this may be done every 5 years if you have a Pap test in combination with an HPV test. Other tests  Sexually transmitted disease (STD) testing.  Bone density scan. This is done to screen for osteoporosis. You may have this scan if you are at high risk for osteoporosis. Follow these instructions at home: Eating and drinking  Eat a diet that includes fresh fruits and vegetables, whole grains, lean protein, and low-fat dairy.  Take vitamin and mineral supplements as recommended by your health care provider.  Do not drink alcohol if: ? Your health care provider tells you not to drink. ? You are pregnant, may be pregnant, or are planning to become pregnant.  If you drink alcohol: ? Limit how much you have to 0-1 drink a day. ? Be aware of how much alcohol is in your drink. In the [place] one drink equals one 12 oz bottle of beer (355 mL), one 5 oz glass of wine (148 mL), or one 1 oz glass of hard liquor (44 mL). Lifestyle  Take daily care of your teeth and gums.  Stay active. Exercise for at least 30 minutes on 5 or more days each week.  Do not use any products that contain nicotine or tobacco, such as cigarettes, e-cigarettes, and chewing tobacco. If you need help quitting, ask your health care provider.  If you are sexually active, practice safe sex. Use a condom or other form of birth control (contraception) in order to prevent pregnancy and STIs (sexually transmitted infections).  If told by your health care provider, take low-dose aspirin daily starting at age 54. What's next?  Visit your health care provider once a year for a well check visit.  Ask your health care provider how often you should have your eyes and teeth checked.  Stay up to date on all vaccines. This information is not intended to replace advice given to you by your health care provider. Make sure you  discuss any questions you have with your health care provider. Document Revised: 11/04/2017 Document Reviewed: 11/04/2017 Elsevier Patient Education  2020 [employer].

## 2019-12-04 ENCOUNTER — Other Ambulatory Visit: Payer: Self-pay | Admitting: Family Medicine

## 2019-12-04 DIAGNOSIS — E785 Hyperlipidemia, unspecified: Secondary | ICD-10-CM | POA: Insufficient documentation

## 2019-12-04 LAB — LIPID PANEL
Cholesterol: 205 mg/dL — ABNORMAL HIGH (ref <200)
HDL: 65 mg/dL (ref >=50)
LDL Cholesterol (Calc): 120 mg/dL (calc) — ABNORMAL HIGH
Non-HDL Cholesterol (Calc): 140 mg/dL (calc) — ABNORMAL HIGH (ref <130)
Total CHOL/HDL Ratio: 3.2 (calc) (ref <5.0)
Triglycerides: 97 mg/dL (ref <150)

## 2019-12-04 LAB — HEPATITIS C ANTIBODY
Hepatitis C Ab: NONREACTIVE
SIGNAL TO CUT-OFF: 0.01 (ref <1.00)

## 2020-03-04 ENCOUNTER — Other Ambulatory Visit: Payer: Self-pay | Admitting: Obstetrics and Gynecology

## 2020-03-04 DIAGNOSIS — R928 Other abnormal and inconclusive findings on diagnostic imaging of breast: Secondary | ICD-10-CM

## 2020-03-11 ENCOUNTER — Other Ambulatory Visit: Payer: Self-pay

## 2020-03-11 ENCOUNTER — Telehealth (INDEPENDENT_AMBULATORY_CARE_PROVIDER_SITE_OTHER): Payer: BC Managed Care – PPO | Admitting: Medical

## 2020-03-11 DIAGNOSIS — F419 Anxiety disorder, unspecified: Secondary | ICD-10-CM | POA: Diagnosis not present

## 2020-03-11 DIAGNOSIS — J029 Acute pharyngitis, unspecified: Secondary | ICD-10-CM | POA: Diagnosis not present

## 2020-03-11 DIAGNOSIS — R0981 Nasal congestion: Secondary | ICD-10-CM

## 2020-03-11 DIAGNOSIS — J3489 Other specified disorders of nose and nasal sinuses: Secondary | ICD-10-CM | POA: Diagnosis not present

## 2020-03-11 MED ORDER — BENZONATATE 100 MG PO CAPS: 100.0000 mg | ORAL_CAPSULE | Freq: Three times a day (TID) | ORAL | 0 refills | Status: DC | PRN

## 2020-03-11 MED ORDER — FLUTICASONE PROPIONATE 50 MCG/ACT NA SUSP: 1.0000 | Freq: Every day | NASAL | 0 refills | Status: DC

## 2020-03-11 MED ORDER — AZITHROMYCIN 250 MG PO TABS: ORAL_TABLET | ORAL | 0 refills | Status: DC

## 2020-03-11 NOTE — Addendum Note (Signed)
Addended by: Gwenevere Abbot on: 03/11/2020 04:31 PM   Modules accepted: Orders

## 2020-03-11 NOTE — Patient Instructions (Addendum)
Recent signs and symptoms of nasal congestion, sinus pressure, sore throat, earaches and mild body aches.  Patient is fully vaccinated and boosted against Covid.  Doing video visit and decided to go ahead and treat her for potential pharyngitis, sinus infection and ear infection.  Prescribed a azithromycin antibiotic, Flonase nasal spray and benzonatate( to use in the event she starts to cough).  Advised patient she can use the Mucinex but advised against the Sudafed 12 hours.  Patient has upcoming mammogram appointment for Wednesday.  I asked her to call the imaging center and explained her above signs and symptoms.  I might give her advice on where to get a  PCR Covid test.   Recent stress and anxiety regarding the mammogram it could be delayed.  So if her anxiety worsens pending the mammogram asked her to notify me and could prescribe her either BuSpar or potential low-dose benzodiazepine.  Follow-up in 7 days or as needed.

## 2020-03-11 NOTE — Progress Notes (Addendum)
Subjective:    Patient ID: Emily Kirby, female    DOB: 01-01-1966, 55 y.o.   MRN: [id number]  HPI  Virtual Visit via Video Note  I connected with Emily Kirby on 03/11/20 at  2:00 PM EST by a video enabled telemedicine application and verified that I am speaking with the correct person using two identifiers.  Location: Patient: home Provider: office.  Patient did not check vital signs.   I discussed the limitations of evaluation and management by telemedicine and the availability of in person appointments. The patient expressed understanding and agreed to proceed.  History of Present Illness: Pt states on Saturday she has nasal congestion, body aches, st and ear ache.   Pt taking mucinex DM and sudafed 12 hours.   Pt has had 2 covid vaccines plus booster.  At night has head pressure/sinus pressure.  Pt husband had head cold recently only one day.  Pt is interpretor for court.   Pt has mammogram scheduled for repeat. She is very nervous about mammogram.She has scheduled at breast center. Pt is stressed out/worrying.      Observations/Objective:  General-no acute distress, pleasant, oriented. Lungs- on inspection lungs appear unlabored. Neck- no tracheal deviation or jvd on inspection. Neuro- gross motor function appears intact.  Assessment and Plan: Recent signs and symptoms of nasal congestion, sinus pressure, sore throat, earaches and mild body aches.  Patient is fully vaccinated and boosted against Covid.  Doing video visit and decided to go ahead and treat her for potential pharyngitis, sinus infection and ear infection.  Prescribed a azithromycin antibiotic, Flonase nasal spray and benzonatate to use in the event she starts to cough.  Advised patient she can use the Mucinex but advised against the Sudafed 12 hours.  Patient has upcoming mammogram appointment for Wednesday.  I asked her to call the imaging center and explained her above signs and symptoms.  I  might give her advice on where to get a  PCR Covid test.   Recent stress and anxiety regarding the mammogram it could be delayed.  So if her anxiety worsens pending the mammogram asked her to notify me and could prescribe her either BuSpar or potential low-dose benzodiazepine.  Follow-up in 7 days or as needed.   Time spent with patient today was 25  minutes which consisted of chart revdiew, discussing diagnosis, work up treatment and documentation.  Follow Up Instructions:    I discussed the assessment and treatment plan with the patient. The patient was provided an opportunity to ask questions and all were answered. The patient agreed with the plan and demonstrated an understanding of the instructions.   The patient was advised to call back or seek an in-person evaluation if the symptoms worsen or if the condition fails to improve as anticipated.  Time spent with patient today was   minutes which consisted of chart revdiew, discussing diagnosis, work up treatment and documentation.   Esperanza Richters, PA-C     Review of Systems  Constitutional: Negative for chills.  HENT: Positive for congestion, sinus pressure, sinus pain and sore throat.   Respiratory: Negative for cough, shortness of breath and wheezing.   Cardiovascular: Negative for palpitations.  Gastrointestinal: Negative for abdominal pain.  Genitourinary: Negative for dysuria.  Musculoskeletal: Positive for myalgias.  Neurological: Negative for dizziness, weakness, numbness and headaches.  Hematological: Negative for adenopathy. Does not bruise/bleed easily.  Psychiatric/Behavioral: Negative for behavioral problems, confusion and suicidal ideas. The patient is nervous/anxious.     Past  Medical History:  Diagnosis Date   AC (acromioclavicular) joint bone spurs    Allergy    SEASONAL   Chicken pox    Kidney stones    Uterine fibroid      Social History   Socioeconomic History   Marital status: Married     Spouse name: Not on file   Number of children: Not on file   Years of education: Not on file   Highest education level: Not on file  Occupational History    Employer: [employer]    Comment: court interpreter  Tobacco Use   Smoking status: Never Smoker   Smokeless tobacco: Never Used  Substance and Sexual Activity   Alcohol use: Yes    Alcohol/week: 0.0 standard drinks    Comment:  3 X PER WEEK, HAVE NOT HAD ANY IN THE LAST 1 1/2   Drug use: No   Sexual activity: Yes    Partners: Male  Other Topics Concern   Not on file  Social History Narrative   Not on file   Social Determinants of Health   Financial Resource Strain: Not on file  Food Insecurity: Not on file  Transportation Needs: Not on file  Physical Activity: Not on file  Stress: Not on file  Social Connections: Not on file  Intimate Partner Violence: Not on file    Past Surgical History:  Procedure Laterality Date   CESAREAN SECTION     DILATION AND CURETTAGE OF UTERUS     LIPOSUCTION      Family History  Problem Relation Age of Onset   Cancer Father        prostate   Hearing loss Father    COPD Father    Heart disease Father        MI   Prostate cancer Father    Hypertension Mother    Colon cancer Neg Hx     Allergies  Allergen Reactions   Codeine Other (See Comments)    Stomach upset   Pseudoephedrine Other (See Comments)    Tingling all over body    Current Outpatient Medications on File Prior to Visit  Medication Sig Dispense Refill   buPROPion (WELLBUTRIN XL) 300 MG 24 hr tablet Take 1 tablet (300 mg total) by mouth daily. 90 tablet 3   hydrocortisone 2.5 % cream Apply topically 2 (two) times daily. 30 g 0   Norethin Ace-Eth Estrad-FE 1-20 MG-MCG(24) CAPS Take 1 capsule by mouth daily.     cetirizine (ZYRTEC) 10 MG tablet Take 10 mg by mouth daily. (Patient not taking: Reported on 03/11/2020)     No current facility-administered medications  on file prior to visit.    There were no vitals taken for this visit.      Objective:   Physical Exam        Assessment & Plan:

## 2020-03-13 ENCOUNTER — Ambulatory Visit: Payer: BC Managed Care – PPO

## 2020-03-13 ENCOUNTER — Ambulatory Visit
Admission: RE | Admit: 2020-03-13 | Discharge: 2020-03-13 | Disposition: A | Discharge status: Home / Self Care | Payer: BC Managed Care – PPO | Source: Ambulatory Visit | Attending: Obstetrics and Gynecology | Admitting: Obstetrics and Gynecology

## 2020-03-13 ENCOUNTER — Other Ambulatory Visit: Payer: Self-pay

## 2020-03-13 DIAGNOSIS — R928 Other abnormal and inconclusive findings on diagnostic imaging of breast: Secondary | ICD-10-CM

## 2020-03-14 ENCOUNTER — Encounter: Payer: Self-pay | Admitting: Medical

## 2020-03-20 ENCOUNTER — Other Ambulatory Visit: Payer: BC Managed Care – PPO

## 2020-04-02 ENCOUNTER — Other Ambulatory Visit: Payer: Self-pay | Admitting: Medical

## 2020-04-04 ENCOUNTER — Telehealth: Payer: Self-pay | Admitting: Medical

## 2020-04-04 MED ORDER — CEFDINIR 300 MG PO CAPS: 300.0000 mg | ORAL_CAPSULE | Freq: Two times a day (BID) | ORAL | 0 refills | Status: DC

## 2020-04-04 NOTE — Telephone Encounter (Signed)
Rx cefdnir sent and holding zpack due to potential interaction.

## 2020-08-08 ENCOUNTER — Ambulatory Visit: Payer: BC Managed Care – PPO | Admitting: Internal Medicine

## 2020-08-08 ENCOUNTER — Other Ambulatory Visit: Payer: Self-pay

## 2020-08-08 VITALS — BP 133/83 | HR 84 | Temp 97.8°F | Ht 62.0 in | Wt 121.0 lb

## 2020-08-08 DIAGNOSIS — R1030 Lower abdominal pain, unspecified: Secondary | ICD-10-CM | POA: Diagnosis not present

## 2020-08-08 DIAGNOSIS — R399 Unspecified symptoms and signs involving the genitourinary system: Secondary | ICD-10-CM

## 2020-08-08 LAB — POC URINALSYSI DIPSTICK (AUTOMATED)
Bilirubin, UA: NEGATIVE
Blood, UA: NEGATIVE
Glucose, UA: NEGATIVE
Ketones, UA: NEGATIVE
Leukocytes, UA: NEGATIVE
Nitrite, UA: NEGATIVE
Protein, UA: POSITIVE — AB
Spec Grav, UA: 1.015 (ref 1.010–1.025)
Urobilinogen, UA: 0.2 E.U./dL
pH, UA: 7 (ref 5.0–8.0)

## 2020-08-08 LAB — URINALYSIS, ROUTINE W REFLEX MICROSCOPIC
Bilirubin Urine: NEGATIVE
Hgb urine dipstick: NEGATIVE
Ketones, ur: NEGATIVE
Leukocytes,Ua: NEGATIVE
Nitrite: NEGATIVE
RBC / HPF: NONE SEEN (ref 0–?)
Specific Gravity, Urine: 1.01 (ref 1.000–1.030)
Total Protein, Urine: NEGATIVE
Urine Glucose: NEGATIVE
Urobilinogen, UA: 0.2 (ref 0.0–1.0)
pH: 7.5 (ref 5.0–8.0)

## 2020-08-08 NOTE — Patient Instructions (Signed)
Drink plenty of fluids  Your urine culture will be ready in 2 to 3 days  If you have fever, chills, increasing stomach pain, blood in the urine, constipation: Please call.  You may need a CAT scan, ultrasound or see your gynecologist.

## 2020-08-08 NOTE — Progress Notes (Signed)
Subjective:    Patient ID: Emily Kirby, female    DOB: Aug 13, 1965, 55 y.o.   MRN: [id number]  DOS:  08/08/2020 Type of visit - description: Acute  Symptoms a started yesterday, has on and off burning suprapubic discomfort. She thinks she could have a UTI.  She denies fever chills No nausea or vomiting No blood in the stools She has occasional urinary frequency but that is a chronic issue. No flank pain. She is on birth control, does not have periods but  occasionally spots. The last time she spot was 10 days ago. Denies any vaginal rash or vaginal discharge. No constipation, last BM was today and normal.    Review of Systems See above   Past Medical History:  Diagnosis Date  . AC (acromioclavicular) joint bone spurs   . Allergy    SEASONAL  . Chicken pox   . Kidney stones   . Uterine fibroid     Past Surgical History:  Procedure Laterality Date  . CESAREAN SECTION    . DILATION AND CURETTAGE OF UTERUS    . LIPOSUCTION      Allergies as of 08/08/2020      Reactions   Codeine Other (See Comments)   Stomach upset   Pseudoephedrine Other (See Comments)   Tingling all over body      Medication List       Accurate as of August 08, 2020 12:02 PM. If you have any questions, ask your nurse or doctor.        benzonatate 100 MG capsule Commonly known as: TESSALON Take 1 capsule (100 mg total) by mouth 3 (three) times daily as needed for cough.   buPROPion 300 MG 24 hr tablet Commonly known as: WELLBUTRIN XL Take 1 tablet (300 mg total) by mouth daily.   cefdinir 300 MG capsule Commonly known as: OMNICEF Take 1 capsule (300 mg total) by mouth 2 (two) times daily.   cetirizine 10 MG tablet Commonly known as: ZYRTEC Take 10 mg by mouth daily.   fluticasone 50 MCG/ACT nasal spray Commonly known as: FLONASE PLACE 1-2 SPRAYS INTO BOTH NOSTRILS DAILY.   hydrocortisone 2.5 % cream Apply topically 2 (two) times daily.   Norethin Ace-Eth Estrad-FE 1-20 MG-MCG(24)  Caps Take 1 capsule by mouth daily.          Objective:   Physical Exam BP 133/83 (BP Location: Right Arm, Patient Position: Sitting, Cuff Size: Large)   Pulse 84   Temp 97.8 F (36.6 C) (Temporal)   Ht 5\' 2"  (1.575 m)   Wt 121 lb (54.9 kg)   SpO2 98%   BMI 22.13 kg/m  General:   Well developed, NAD, BMI noted.  HEENT:  Normocephalic . Face symmetric, atraumatic Lungs:  CTA B Normal respiratory effort, no intercostal retractions, no accessory muscle use. Heart: RRR,  no murmur.  Abdomen: Minimal discomfort at the left suprapubic area and left lower quadrant without mass or rebound.  Good bowel sounds.  No mass. Skin: Not pale. Not jaundice Lower extremities: no pretibial edema bilaterally  Neurologic:  alert & oriented X3.  Speech normal, gait appropriate for age and unassisted Psych--  Cognition and judgment appear intact.  Cooperative with normal attention span and concentration.  Behavior appropriate. No anxious or depressed appearing.     Assessment      55 year old female, PMH includes depression, hyperlipidemia, C-section, presents with  Lower abdominal discomfort: As described above, U dip showed trace protein.   symptoms could be  related to a cystitis but she is also tender at the left lower quadrant (without mass or rebound).  Ovarian cyst?  Early diverticulitis?  other?. Plan: UA, urine culture, drink plenty fluids, monitor symptoms, notify me if fever, chills, see AVS.  This visit occurred during the SARS-CoV-2 public health emergency.  Safety protocols were in place, including screening questions prior to the visit, additional usage of staff PPE, and extensive cleaning of exam room while observing appropriate contact time as indicated for disinfecting solutions.

## 2020-08-09 LAB — URINE CULTURE
MICRO NUMBER:: 11961110
Result:: NO GROWTH
SPECIMEN QUALITY:: ADEQUATE

## 2020-09-25 ENCOUNTER — Telehealth: Payer: Self-pay

## 2020-09-25 NOTE — Telephone Encounter (Signed)
Needs to make same day appt. Pls call back  Telephone: [phone]

## 2020-09-25 NOTE — Telephone Encounter (Signed)
Appt scheduled 09/26/20.

## 2020-09-26 ENCOUNTER — Other Ambulatory Visit: Payer: Self-pay

## 2020-09-26 ENCOUNTER — Telehealth (INDEPENDENT_AMBULATORY_CARE_PROVIDER_SITE_OTHER): Payer: BC Managed Care – PPO | Admitting: Internal Medicine

## 2020-09-26 ENCOUNTER — Encounter: Payer: Self-pay | Admitting: Internal Medicine

## 2020-09-26 VITALS — Temp 97.5°F | Ht 62.0 in

## 2020-09-26 DIAGNOSIS — J069 Acute upper respiratory infection, unspecified: Secondary | ICD-10-CM | POA: Diagnosis not present

## 2020-09-26 MED ORDER — AMOXICILLIN 875 MG PO TABS: 875.0000 mg | ORAL_TABLET | Freq: Two times a day (BID) | ORAL | 0 refills | Status: DC

## 2020-09-26 NOTE — Progress Notes (Signed)
Subjective:    Patient ID: Emily Kirby, female    DOB: 05-15-1965, 55 y.o.   MRN: [id number]  DOS:  09/26/2020 Type of visit - description: Virtual Visit via Video Note  I connected with the above patient  by a video enabled telemedicine application and verified that I am speaking with the correct person using two identifiers.   THIS ENCOUNTER IS A VIRTUAL VISIT DUE TO COVID-19 - PATIENT WAS NOT SEEN IN THE OFFICE. PATIENT HAS CONSENTED TO VIRTUAL VISIT / TELEMEDICINE VISIT   Location of patient: home  Location of provider: office  Persons participating in the virtual visit: patient, provider   I discussed the limitations of evaluation and management by telemedicine and the availability of in person appointments. The patient expressed understanding and agreed to proceed.  Acute Symptoms a started 5 days ago: Nasal congestion, clear to yellowish nasal discharge. Some ear pressure. "Like a cold".  No fever chills No chest pain or difficulty breathing + Malaise. No nausea vomiting. Had cough the first day of illness.     Review of Systems See above   Past Medical History:  Diagnosis Date   AC (acromioclavicular) joint bone spurs    Allergy    SEASONAL   Chicken pox    Kidney stones    Uterine fibroid     Past Surgical History:  Procedure Laterality Date   CESAREAN SECTION     DILATION AND CURETTAGE OF UTERUS     LIPOSUCTION      Allergies as of 09/26/2020       Reactions   Codeine Other (See Comments)   Stomach upset   Pseudoephedrine Other (See Comments)   Tingling all over body        Medication List        Accurate as of September 26, 2020  8:54 AM. If you have any questions, ask your nurse or doctor.          benzonatate 100 MG capsule Commonly known as: TESSALON Take 1 capsule (100 mg total) by mouth 3 (three) times daily as needed for cough.   buPROPion 300 MG 24 hr tablet Commonly known as: WELLBUTRIN XL Take 1 tablet (300 mg total) by  mouth daily.   cefdinir 300 MG capsule Commonly known as: OMNICEF Take 1 capsule (300 mg total) by mouth 2 (two) times daily.   cetirizine 10 MG tablet Commonly known as: ZYRTEC Take 10 mg by mouth daily.   fluticasone 50 MCG/ACT nasal spray Commonly known as: FLONASE PLACE 1-2 SPRAYS INTO BOTH NOSTRILS DAILY.   hydrocortisone 2.5 % cream Apply topically 2 (two) times daily.   Norethin Ace-Eth Estrad-FE 1-20 MG-MCG(24) Caps Take 1 capsule by mouth daily.           Objective:   Physical Exam Ht 5\' 2"  (1.575 m)   BMI 22.13 kg/m  This is a virtual video visit, alert oriented x3, in no distress.  Nontoxic-appearing.  No blood pressure or O2 sats available.    Assessment    55 year old female, PMH includes depression, hyperlipidemia, C-section, presents with  Viral syndrome, URI: Symptoms as described above, 5 days ago tested negative for COVID. COVID test recheck today: Negative Plan: Rest, fluids, Flonase, Mucinex DM, tylenol.  If not better in few days and is still having sinus congestion, Start amoxicillin only if not better in the next few days, Rx sent     I discussed the assessment and treatment plan with the patient. The patient was  provided an opportunity to ask questions and all were answered. The patient agreed with the plan and demonstrated an understanding of the instructions.   The patient was advised to call back or seek an in-person evaluation if the symptoms worsen or if the condition fails to improve as anticipated.

## 2020-12-02 ENCOUNTER — Encounter: Payer: Self-pay | Admitting: Family Medicine

## 2020-12-02 ENCOUNTER — Ambulatory Visit (INDEPENDENT_AMBULATORY_CARE_PROVIDER_SITE_OTHER): Payer: BC Managed Care – PPO | Admitting: Family Medicine

## 2020-12-02 ENCOUNTER — Other Ambulatory Visit: Payer: Self-pay

## 2020-12-02 VITALS — BP 120/80 | HR 87 | Temp 98.7°F | Resp 18 | Ht 62.0 in | Wt 122.4 lb

## 2020-12-02 DIAGNOSIS — Z0000 Encounter for general adult medical examination without abnormal findings: Secondary | ICD-10-CM | POA: Diagnosis not present

## 2020-12-02 DIAGNOSIS — Z23 Encounter for immunization: Secondary | ICD-10-CM | POA: Diagnosis not present

## 2020-12-02 DIAGNOSIS — F418 Other specified anxiety disorders: Secondary | ICD-10-CM

## 2020-12-02 LAB — LIPID PANEL
Cholesterol: 210 mg/dL — ABNORMAL HIGH (ref 0–200)
HDL: 60.6 mg/dL (ref >39.00)
LDL Cholesterol: 128 mg/dL — ABNORMAL HIGH (ref 0–99)
NonHDL: 149.81
Total CHOL/HDL Ratio: 3
Triglycerides: 110 mg/dL (ref 0.0–149.0)
VLDL: 22 mg/dL (ref 0.0–40.0)

## 2020-12-02 LAB — B12 AND FOLATE PANEL
Folate: 15.5 ng/mL (ref >5.9)
Vitamin B-12: 296 pg/mL (ref 211–911)

## 2020-12-02 LAB — CBC WITH DIFFERENTIAL/PLATELET
Basophils Absolute: 0.1 10*3/uL (ref 0.0–0.1)
Basophils Relative: 1 % (ref 0.0–3.0)
Eosinophils Absolute: 0.1 10*3/uL (ref 0.0–0.7)
Eosinophils Relative: 2.1 % (ref 0.0–5.0)
HCT: 41.5 % (ref 36.0–46.0)
Hemoglobin: 13.4 g/dL (ref 12.0–15.0)
Lymphocytes Relative: 23.3 % (ref 12.0–46.0)
Lymphs Abs: 1.3 10*3/uL (ref 0.7–4.0)
MCHC: 32.4 g/dL (ref 30.0–36.0)
MCV: 86.2 fl (ref 78.0–100.0)
Monocytes Absolute: 0.3 10*3/uL (ref 0.1–1.0)
Monocytes Relative: 4.7 % (ref 3.0–12.0)
Neutro Abs: 3.9 10*3/uL (ref 1.4–7.7)
Neutrophils Relative %: 68.9 % (ref 43.0–77.0)
Platelets: 295 10*3/uL (ref 150.0–400.0)
RBC: 4.81 Mil/uL (ref 3.87–5.11)
RDW: 13.7 % (ref 11.5–15.5)
WBC: 5.7 10*3/uL (ref 4.0–10.5)

## 2020-12-02 LAB — COMPREHENSIVE METABOLIC PANEL
ALT: 16 U/L (ref 0–35)
AST: 16 U/L (ref 0–37)
Albumin: 4.2 g/dL (ref 3.5–5.2)
Alkaline Phosphatase: 46 U/L (ref 39–117)
BUN: 12 mg/dL (ref 6–23)
CO2: 27 mEq/L (ref 19–32)
Calcium: 9.1 mg/dL (ref 8.4–10.5)
Chloride: 104 mEq/L (ref 96–112)
Creatinine, Ser: 0.82 mg/dL (ref 0.40–1.20)
GFR: 80.64 mL/min (ref >60.00)
Glucose, Bld: 83 mg/dL (ref 70–99)
Potassium: 4.3 mEq/L (ref 3.5–5.1)
Sodium: 139 mEq/L (ref 135–145)
Total Bilirubin: 0.4 mg/dL (ref 0.2–1.2)
Total Protein: 6.7 g/dL (ref 6.0–8.3)

## 2020-12-02 LAB — TSH: TSH: 3.66 u[IU]/mL (ref 0.35–5.50)

## 2020-12-02 MED ORDER — ESCITALOPRAM OXALATE 10 MG PO TABS: 10.0000 mg | ORAL_TABLET | Freq: Every day | ORAL | 2 refills | Status: DC

## 2020-12-02 MED ORDER — BUPROPION HCL ER (XL) 150 MG PO TB24: 150.0000 mg | ORAL_TABLET | Freq: Every day | ORAL | 1 refills | Status: DC

## 2020-12-02 NOTE — Patient Instructions (Signed)
Preventive Care 40-55 Years Old, Female Preventive care refers to lifestyle choices and visits with your health care provider that can promote health and wellness. This includes: A yearly physical exam. This is also called an annual wellness visit. Regular dental and eye exams. Immunizations. Screening for certain conditions. Healthy lifestyle choices, such as: Eating a healthy diet. Getting regular exercise. Not using drugs or products that contain nicotine and tobacco. Limiting alcohol use. What can I expect for my preventive care visit? Physical exam Your health care provider will check your: Height and weight. These may be used to calculate your BMI (body mass index). BMI is a measurement that tells if you are at a healthy weight. Heart rate and blood pressure. Body temperature. Skin for abnormal spots. Counseling Your health care provider may ask you questions about your: Past medical problems. Family's medical history. Alcohol, tobacco, and drug use. Emotional well-being. Home life and relationship well-being. Sexual activity. Diet, exercise, and sleep habits. Work and work environment. Access to firearms. Method of birth control. Menstrual cycle. Pregnancy history. What immunizations do I need? Vaccines are usually given at various ages, according to a schedule. Your health care provider will recommend vaccines for you based on your age, medical history, and lifestyle or other factors, such as travel or where you work. What tests do I need? Blood tests Lipid and cholesterol levels. These may be checked every 5 years, or more often if you are over 50 years old. Hepatitis C test. Hepatitis B test. Screening Lung cancer screening. You may have this screening every year starting at age 55 if you have a 30-pack-year history of smoking and currently smoke or have quit within the past 15 years. Colorectal cancer screening. All adults should have this screening starting at  age 50 and continuing until age 75. Your health care provider may recommend screening at age 45 if you are at increased risk. You will have tests every 1-10 years, depending on your results and the type of screening test. Diabetes screening. This is done by checking your blood sugar (glucose) after you have not eaten for a while (fasting). You may have this done every 1-3 years. Mammogram. This may be done every 1-2 years. Talk with your health care provider about when you should start having regular mammograms. This may depend on whether you have a family history of breast cancer. BRCA-related cancer screening. This may be done if you have a family history of breast, ovarian, tubal, or peritoneal cancers. Pelvic exam and Pap test. This may be done every 3 years starting at age 21. Starting at age 30, this may be done every 5 years if you have a Pap test in combination with an HPV test. Other tests STD (sexually transmitted disease) testing, if you are at risk. Bone density scan. This is done to screen for osteoporosis. You may have this scan if you are at high risk for osteoporosis. Talk with your health care provider about your test results, treatment options, and if necessary, the need for more tests. Follow these instructions at home: Eating and drinking  Eat a diet that includes fresh fruits and vegetables, whole grains, lean protein, and low-fat dairy products. Take vitamin and mineral supplements as recommended by your health care provider. Do not drink alcohol if: Your health care provider tells you not to drink. You are pregnant, may be pregnant, or are planning to become pregnant. If you drink alcohol: Limit how much you have to 0-1 drink a day. Be   aware of how much alcohol is in your drink. In the [place] one drink equals one 12 oz bottle of beer (355 mL), one 5 oz glass of wine (148 mL), or one 1 oz glass of hard liquor (44 mL). Lifestyle Take daily care of your teeth and  gums. Brush your teeth every morning and night with fluoride toothpaste. Floss one time each day. Stay active. Exercise for at least 30 minutes 5 or more days each week. Do not use any products that contain nicotine or tobacco, such as cigarettes, e-cigarettes, and chewing tobacco. If you need help quitting, ask your health care provider. Do not use drugs. If you are sexually active, practice safe sex. Use a condom or other form of protection to prevent STIs (sexually transmitted infections). If you do not wish to become pregnant, use a form of birth control. If you plan to become pregnant, see your health care provider for a prepregnancy visit. If told by your health care provider, take low-dose aspirin daily starting at age 63. Find healthy ways to cope with stress, such as: Meditation, yoga, or listening to music. Journaling. Talking to a trusted person. Spending time with friends and family. Safety Always wear your seat belt while driving or riding in a vehicle. Do not drive: If you have been drinking alcohol. Do not ride with someone who has been drinking. When you are tired or distracted. While texting. Wear a helmet and other protective equipment during sports activities. If you have firearms in your house, make sure you follow all gun safety procedures. What's next? Visit your health care provider once a year for an annual wellness visit. Ask your health care provider how often you should have your eyes and teeth checked. Stay up to date on all vaccines. This information is not intended to replace advice given to you by your health care provider. Make sure you discuss any questions you have with your health care provider. Document Revised: 05/03/2020 Document Reviewed: 11/04/2017 Elsevier Patient Education  2022 [employer].

## 2020-12-02 NOTE — Progress Notes (Signed)
Subjective:     Emily Kirby is a 55 y.o. female and is here for a comprehensive physical exam. The patient reports problems - increased stress / anxiety.  She wonders if we can add something to to wellbutrin.   No other complaints .   Marland Kitchen  Social History   Socioeconomic History   Marital status: Married    Spouse name: Not on file   Number of children: Not on file   Years of education: Not on file   Highest education level: Not on file  Occupational History    Employer: [employer]    Comment: court interpreter  Tobacco Use   Smoking status: Never   Smokeless tobacco: Never  Substance and Sexual Activity   Alcohol use: Yes    Alcohol/week: 0.0 standard drinks    Comment:  3 X PER WEEK, HAVE NOT HAD ANY IN THE LAST 1 1/2   Drug use: No   Sexual activity: Yes    Partners: Male  Other Topics Concern   Not on file  Social History Narrative   Not on file   Social Determinants of Health   Financial Resource Strain: Not on file  Food Insecurity: Not on file  Transportation Needs: Not on file  Physical Activity: Not on file  Stress: Not on file  Social Connections: Not on file  Intimate Partner Violence: Not on file   Health Maintenance  Topic Date Due   HIV Screening  Never done   COVID-19 Vaccine (4 - Booster for [place] series) 05/06/2020   INFLUENZA VACCINE  10/07/2020   MAMMOGRAM  03/13/2021   PAP SMEAR-Modifier  01/08/2023   TETANUS/TDAP  10/10/2023   COLONOSCOPY (Pts 45-11yrs Insurance coverage will need to be confirmed)  06/30/2026   Hepatitis C Screening  Completed   Zoster Vaccines- Shingrix  Completed   HPV VACCINES  Aged Out    The following portions of the patient's history were reviewed and updated as appropriate: allergies, current medications, past family history, past medical history, past social history, past surgical history, and problem list.  Review of Systems Review of Systems  Constitutional: Negative for activity change,  appetite change and fatigue.  HENT: Negative for hearing loss, congestion, tinnitus and ear discharge.  dentist q66m Eyes: Negative for visual disturbance (see optho q1y -- vision corrected to 20/20 with glasses).  Respiratory: Negative for cough, chest tightness and shortness of breath.   Cardiovascular: Negative for chest pain, palpitations and leg swelling.  Gastrointestinal: Negative for abdominal pain, diarrhea, constipation and abdominal distention.  Genitourinary: Negative for urgency, frequency, decreased urine volume and difficulty urinating.  Musculoskeletal: Negative for back pain, arthralgias and gait problem.  Skin: Negative for color change, pallor and rash.  Neurological: Negative for dizziness, light-headedness, numbness and headaches.  Hematological: Negative for adenopathy. Does not bruise/bleed easily.  Psychiatric/Behavioral: Negative for suicidal ideas, confusion, sleep disturbance, self-injury, dysphoric mood, decreased concentration and agitation. ---  inc stress/ anxiety      Objective:    BP 120/80 (BP Location: Left Arm, Patient Position: Sitting, Cuff Size: Normal)   Pulse 87   Temp 98.7 F (37.1 C) (Oral)   Resp 18   Ht 5\' 2"  (1.575 m)   Wt 122 lb 6.4 oz (55.5 kg)   SpO2 99%   BMI 22.39 kg/m  General appearance: alert, cooperative, and no distress Head: Normocephalic, without obvious abnormality, atraumatic Eyes: negative findings: lids and lashes normal, conjunctivae and sclerae normal, and pupils equal,  round, reactive to light and accomodation Ears: normal TM's and external ear canals both ears Neck: no adenopathy, no carotid bruit, no JVD, supple, symmetrical, trachea midline, and thyroid not enlarged, symmetric, no tenderness/mass/nodules Back: symmetric, no curvature. ROM normal. No CVA tenderness. Lungs: clear to auscultation bilaterally Breasts:  per gyn Heart: regular rate and rhythm, S1, S2 normal, no murmur, click, rub or gallop Abdomen:  soft, non-tender; bowel sounds normal; no masses,  no organomegaly Pelvic: deferred-- gyn Extremities: extremities normal, atraumatic, no cyanosis or edema Pulses: 2+ and symmetric Skin: Skin color, texture, turgor normal. No rashes or lesions Lymph nodes: Cervical, supraclavicular, and axillary nodes normal. Neurologic: Alert and oriented X 3, normal strength and tone. Normal symmetric reflexes. Normal coordination and gait   Psych----  pt answered questions appropriately----  pt is not suicidal  Assessment:    Healthy female exam    Plan:    Ghm utd Check labs See After Visit Summary for Counseling Recommendations   1. Need for influenza vaccination   - Flu Vaccine QUAD 67mo+IM (Fluarix, Fluzone & Alfiuria Quad PF)  2. Depression with anxiety Dec wellbutrin to 150 mg and add lexapro  F/u 1 month - buPROPion (WELLBUTRIN XL) 150 MG 24 hr tablet; Take 1 tablet (150 mg total) by mouth daily.  Dispense: 90 tablet; Refill: 1 - escitalopram (LEXAPRO) 10 MG tablet; Take 1 tablet (10 mg total) by mouth daily.  Dispense: 30 tablet; Refill: 2 - B12 and Folate Panel  3. Preventative health care See above - CBC with Differential/Platelet - Lipid panel - Comprehensive metabolic panel - TSH

## 2020-12-05 ENCOUNTER — Other Ambulatory Visit: Payer: Self-pay | Admitting: Family Medicine

## 2020-12-05 DIAGNOSIS — E785 Hyperlipidemia, unspecified: Secondary | ICD-10-CM

## 2020-12-06 ENCOUNTER — Encounter: Payer: BC Managed Care – PPO | Admitting: Family Medicine

## 2020-12-09 ENCOUNTER — Other Ambulatory Visit: Payer: Self-pay | Admitting: Family Medicine

## 2020-12-09 DIAGNOSIS — F32A Depression, unspecified: Secondary | ICD-10-CM

## 2020-12-24 ENCOUNTER — Other Ambulatory Visit: Payer: Self-pay | Admitting: Family Medicine

## 2020-12-24 DIAGNOSIS — F418 Other specified anxiety disorders: Secondary | ICD-10-CM

## 2021-01-21 ENCOUNTER — Other Ambulatory Visit: Payer: Self-pay | Admitting: Obstetrics and Gynecology

## 2021-01-21 ENCOUNTER — Other Ambulatory Visit: Payer: Self-pay | Admitting: Family Medicine

## 2021-01-21 DIAGNOSIS — F418 Other specified anxiety disorders: Secondary | ICD-10-CM

## 2021-01-21 DIAGNOSIS — Z1231 Encounter for screening mammogram for malignant neoplasm of breast: Secondary | ICD-10-CM

## 2021-03-20 ENCOUNTER — Ambulatory Visit
Admission: RE | Admit: 2021-03-20 | Discharge: 2021-03-20 | Disposition: A | Discharge status: Home / Self Care | Payer: BC Managed Care – PPO | Source: Ambulatory Visit | Attending: Obstetrics and Gynecology | Admitting: Obstetrics and Gynecology

## 2021-03-20 DIAGNOSIS — Z1231 Encounter for screening mammogram for malignant neoplasm of breast: Secondary | ICD-10-CM

## 2021-03-27 ENCOUNTER — Other Ambulatory Visit (INDEPENDENT_AMBULATORY_CARE_PROVIDER_SITE_OTHER): Payer: BC Managed Care – PPO

## 2021-03-27 ENCOUNTER — Encounter: Payer: Self-pay | Admitting: Family Medicine

## 2021-03-27 DIAGNOSIS — E785 Hyperlipidemia, unspecified: Secondary | ICD-10-CM

## 2021-03-27 LAB — COMPREHENSIVE METABOLIC PANEL
ALT: 15 U/L (ref 0–35)
AST: 16 U/L (ref 0–37)
Albumin: 4.2 g/dL (ref 3.5–5.2)
Alkaline Phosphatase: 43 U/L (ref 39–117)
BUN: 13 mg/dL (ref 6–23)
CO2: 29 mEq/L (ref 19–32)
Calcium: 8.7 mg/dL (ref 8.4–10.5)
Chloride: 104 mEq/L (ref 96–112)
Creatinine, Ser: 0.84 mg/dL (ref 0.40–1.20)
GFR: 78.17 mL/min (ref >60.00)
Glucose, Bld: 79 mg/dL (ref 70–99)
Potassium: 4.2 mEq/L (ref 3.5–5.1)
Sodium: 139 mEq/L (ref 135–145)
Total Bilirubin: 0.5 mg/dL (ref 0.2–1.2)
Total Protein: 6.5 g/dL (ref 6.0–8.3)

## 2021-03-27 LAB — LIPID PANEL
Cholesterol: 199 mg/dL (ref 0–200)
HDL: 55.5 mg/dL (ref >39.00)
LDL Cholesterol: 119 mg/dL — ABNORMAL HIGH (ref 0–99)
NonHDL: 143.38
Total CHOL/HDL Ratio: 4
Triglycerides: 124 mg/dL (ref 0.0–149.0)
VLDL: 24.8 mg/dL (ref 0.0–40.0)

## 2021-05-14 ENCOUNTER — Encounter: Payer: Self-pay | Admitting: Family Medicine

## 2021-05-15 ENCOUNTER — Other Ambulatory Visit: Payer: Self-pay | Admitting: Family Medicine

## 2021-05-15 DIAGNOSIS — F418 Other specified anxiety disorders: Secondary | ICD-10-CM

## 2021-05-19 ENCOUNTER — Ambulatory Visit: Payer: BC Managed Care – PPO | Admitting: Family Medicine

## 2021-05-19 ENCOUNTER — Encounter: Payer: Self-pay | Admitting: Family Medicine

## 2021-05-19 VITALS — BP 120/80 | HR 102 | Temp 98.7°F | Resp 18 | Ht 62.0 in | Wt 123.6 lb

## 2021-05-19 DIAGNOSIS — R5383 Other fatigue: Secondary | ICD-10-CM | POA: Insufficient documentation

## 2021-05-19 DIAGNOSIS — F418 Other specified anxiety disorders: Secondary | ICD-10-CM

## 2021-05-19 DIAGNOSIS — J324 Chronic pansinusitis: Secondary | ICD-10-CM

## 2021-05-19 DIAGNOSIS — F325 Major depressive disorder, single episode, in full remission: Secondary | ICD-10-CM

## 2021-05-19 LAB — POC COVID19 BINAXNOW: SARS Coronavirus 2 Ag: NEGATIVE

## 2021-05-19 MED ORDER — CEFDINIR 300 MG PO CAPS: 300.0000 mg | ORAL_CAPSULE | Freq: Two times a day (BID) | ORAL | 0 refills | Status: DC

## 2021-05-19 MED ORDER — DESVENLAFAXINE SUCCINATE ER 50 MG PO TB24: 50.0000 mg | ORAL_TABLET | Freq: Every day | ORAL | 3 refills | Status: DC

## 2021-05-19 NOTE — Assessment & Plan Note (Signed)
abx per orders ?con't flonase and antihistamine ?

## 2021-05-19 NOTE — Patient Instructions (Signed)

## 2021-05-19 NOTE — Assessment & Plan Note (Signed)
Check labs  ?Stop lexapro ?

## 2021-05-19 NOTE — Progress Notes (Signed)
? ?Subjective:  ? ?By signing my name below, I, [name], attest that this documentation has been prepared under the direction and in the presence of Roma Schanz DO, 05/19/2021 ? ? Patient ID: Emily Kirby, female    DOB: Sep 15, 1965, 56 y.o.   MRN: [id number] ? ?Chief Complaint  ?Patient presents with  ? Sinus Problem  ?  Pt states sxs started on Saturday and states pressure in left ear and states yellow mucus. Pt states having sinus pressure.   ? ? ?HPI ?Patient is in today for an office visit. ? ?Patient complains of cold - like symptoms that appeared on 05/17/2021. She states that she feels congestion in her head. She also has been coughing up mucus as of last weekend. She has been using Sudafed and Ibuprofen to help alleviate symptoms.  ? ?She has been sleeping well at night but reported feeling fatigue throughout the day. She is interested in discontinuing use of Wellbutrin XL and Lexapro.  ? ?She has not been taking 10 MG of Zyrtec regularly.  ? ? ? ?Past Medical History:  ?Diagnosis Date  ? AC (acromioclavicular) joint bone spurs   ? Allergy   ? SEASONAL  ? Chicken pox   ? Kidney stones   ? Uterine fibroid   ? ? ?Past Surgical History:  ?Procedure Laterality Date  ? CESAREAN SECTION    ? DILATION AND CURETTAGE OF UTERUS    ? LIPOSUCTION    ? ? ?Family History  ?Problem Relation Age of Onset  ? Hypertension Mother   ? Hyperlipidemia Mother   ? Cancer Father   ?     prostate  ? Hearing loss Father   ? COPD Father   ? Heart disease Father   ?     MI  ? Prostate cancer Father   ? Colon cancer Neg Hx   ? ? ?Social History  ? ?Socioeconomic History  ? Marital status: Married  ?  Spouse name: Not on file  ? Number of children: Not on file  ? Years of education: Not on file  ? Highest education level: Not on file  ?Occupational History  ?  Employer: [employer]  ?  Comment: court interpreter  ?Tobacco Use  ? Smoking status: Never  ? Smokeless tobacco: Never  ?Substance and Sexual  Activity  ? Alcohol use: Yes  ?  Alcohol/week: 7.0 standard drinks  ?  Types: 7 Shots of liquor per week  ?  Comment: 1 shot a night  ? Drug use: No  ? Sexual activity: Yes  ?  Partners: Male  ?Other Topics Concern  ? Not on file  ?Social History Narrative  ? Plays tennis and goes to gym for exercise classes   ? ?Social Determinants of Health  ? ?Financial Resource Strain: Not on file  ?Food Insecurity: Not on file  ?Transportation Needs: Not on file  ?Physical Activity: Not on file  ?Stress: Not on file  ?Social Connections: Not on file  ?Intimate Partner Violence: Not on file  ? ? ?Outpatient Medications Prior to Visit  ?Medication Sig Dispense Refill  ? cetirizine (ZYRTEC) 10 MG tablet Take 10 mg by mouth daily.    ? fluticasone (FLONASE) 50 MCG/ACT nasal spray PLACE 1-2 SPRAYS INTO BOTH NOSTRILS DAILY. 16 mL 2  ? Norethin Ace-Eth Estrad-FE 1-20 MG-MCG(24) CAPS Take 1 capsule by mouth daily.    ? buPROPion (WELLBUTRIN XL) 150 MG 24 hr tablet TAKE 1 TABLET BY MOUTH  EVERY DAY 90 tablet 1  ? escitalopram (LEXAPRO) 10 MG tablet TAKE 1 TABLET BY MOUTH EVERY DAY 90 tablet 3  ? ?No facility-administered medications prior to visit.  ? ? ?Allergies  ?Allergen Reactions  ? Codeine Other (See Comments)  ?  Stomach upset  ? Pseudoephedrine Other (See Comments)  ?  Tingling all over body  ? ? ?Review of Systems  ?Constitutional:  Positive for malaise/fatigue. Negative for fever.  ?HENT:  Positive for congestion and sinus pain. Negative for sore throat.   ?Respiratory:  Positive for cough. Negative for sputum production, shortness of breath and wheezing.   ? ?   ?Objective:  ?  ?Physical Exam ?Constitutional:   ?   General: She is not in acute distress. ?   Appearance: Normal appearance. She is not ill-appearing.  ?HENT:  ?   Head: Normocephalic and atraumatic.  ?   Right Ear: External ear normal.  ?   Left Ear: External ear normal.  ?Eyes:  ?   Extraocular Movements: Extraocular movements intact.  ?   Pupils: Pupils are  equal, round, and reactive to light.  ?Cardiovascular:  ?   Rate and Rhythm: Normal rate and regular rhythm.  ?   Heart sounds: Normal heart sounds. No murmur heard. ?  No gallop.  ?Pulmonary:  ?   Effort: Pulmonary effort is normal. No respiratory distress.  ?   Breath sounds: Normal breath sounds. No wheezing or rales.  ?Skin: ?   General: Skin is warm and dry.  ?Neurological:  ?   Mental Status: She is alert and oriented to person, place, and time.  ?Psychiatric:     ?   Judgment: Judgment normal.  ? ? ?BP 120/80 (BP Location: Right Arm, Patient Position: Sitting, Cuff Size: Normal)   Pulse (!) 102   Temp 98.7 ?F (37.1 ?C) (Oral)   Resp 18   Ht '5\' 2"'$  (1.575 m)   Wt 123 lb 9.6 oz (56.1 kg)   SpO2 97%   BMI 22.61 kg/m?  ?Wt Readings from Last 3 Encounters:  ?05/19/21 123 lb 9.6 oz (56.1 kg)  ?12/02/20 122 lb 6.4 oz (55.5 kg)  ?08/08/20 121 lb (54.9 kg)  ? ? ?Diabetic Foot Exam - Simple   ?No data filed ?  ? ?Lab Results  ?Component Value Date  ? WBC 5.7 12/02/2020  ? HGB 13.4 12/02/2020  ? HCT 41.5 12/02/2020  ? PLT 295.0 12/02/2020  ? GLUCOSE 79 03/27/2021  ? CHOL 199 03/27/2021  ? TRIG 124.0 03/27/2021  ? HDL 55.50 03/27/2021  ? LDLCALC 119 (H) 03/27/2021  ? ALT 15 03/27/2021  ? AST 16 03/27/2021  ? NA 139 03/27/2021  ? K 4.2 03/27/2021  ? CL 104 03/27/2021  ? CREATININE 0.84 03/27/2021  ? BUN 13 03/27/2021  ? CO2 29 03/27/2021  ? TSH 3.66 12/02/2020  ? ? ?Lab Results  ?Component Value Date  ? TSH 3.66 12/02/2020  ? ?Lab Results  ?Component Value Date  ? WBC 5.7 12/02/2020  ? HGB 13.4 12/02/2020  ? HCT 41.5 12/02/2020  ? MCV 86.2 12/02/2020  ? PLT 295.0 12/02/2020  ? ?Lab Results  ?Component Value Date  ? NA 139 03/27/2021  ? K 4.2 03/27/2021  ? CO2 29 03/27/2021  ? GLUCOSE 79 03/27/2021  ? BUN 13 03/27/2021  ? CREATININE 0.84 03/27/2021  ? BILITOT 0.5 03/27/2021  ? ALKPHOS 43 03/27/2021  ? AST 16 03/27/2021  ? ALT 15 03/27/2021  ? PROT 6.5 03/27/2021  ?  ALBUMIN 4.2 03/27/2021  ? CALCIUM 8.7 03/27/2021  ?  GFR 78.17 03/27/2021  ? ?Lab Results  ?Component Value Date  ? CHOL 199 03/27/2021  ? ?Lab Results  ?Component Value Date  ? HDL 55.50 03/27/2021  ? ?Lab Results  ?Component Value Date  ? LDLCALC 119 (H) 03/27/2021  ? ?Lab Results  ?Component Value Date  ? TRIG 124.0 03/27/2021  ? ?Lab Results  ?Component Value Date  ? CHOLHDL 4 03/27/2021  ? ?No results found for: HGBA1C ? ?   ?Assessment & Plan:  ? ?Problem List Items Addressed This Visit   ? ?  ? Unprioritized  ? Depression  ?  Pt would like to stop meds to see if symptoms go away ?  ?  ? Relevant Medications  ? desvenlafaxine (PRISTIQ) 50 MG 24 hr tablet  ? Other fatigue - Primary  ?  Check labs  ?Stop lexapro ?  ?  ? Relevant Orders  ? CBC with Differential/Platelet  ? Comprehensive metabolic panel  ? TSH  ? Epstein-Barr virus VCA antibody panel  ? Pansinusitis  ?  abx per orders ?con't flonase and antihistamine ?  ?  ? Relevant Medications  ? cefdinir (OMNICEF) 300 MG capsule  ? Other Relevant Orders  ? POC COVID-19 (Completed)  ? ?Other Visit Diagnoses   ? ? Depression with anxiety      ? Relevant Medications  ? desvenlafaxine (PRISTIQ) 50 MG 24 hr tablet  ? ?  ? ? ? ? ?Meds ordered this encounter  ?Medications  ? desvenlafaxine (PRISTIQ) 50 MG 24 hr tablet  ?  Sig: Take 1 tablet (50 mg total) by mouth daily.  ?  Dispense:  30 tablet  ?  Refill:  3  ? cefdinir (OMNICEF) 300 MG capsule  ?  Sig: Take 1 capsule (300 mg total) by mouth 2 (two) times daily.  ?  Dispense:  20 capsule  ?  Refill:  0  ? ? ?I, [name], DO, personally preformed the services described in this documentation.  All medical record entries made by the scribe were at my direction and in my presence.  I have reviewed the chart and discharge instructions (if applicable) and agree that the record reflects my personal performance and is accurate and complete. 05/19/2021 ? ? ?I,Amber Collins,acting as a Education administrator for [employer], DO.,have documented all relevant documentation on  the behalf of [name], DO,as directed by  [name], DO while in the presence of [name], DO. ? ? ? ?[name], DO ? ?

## 2021-05-19 NOTE — Assessment & Plan Note (Signed)
Pt would like to stop meds to see if symptoms go away ?

## 2021-05-20 LAB — COMPREHENSIVE METABOLIC PANEL
ALT: 18 U/L (ref 0–35)
AST: 18 U/L (ref 0–37)
Albumin: 4.4 g/dL (ref 3.5–5.2)
Alkaline Phosphatase: 57 U/L (ref 39–117)
BUN: 12 mg/dL (ref 6–23)
CO2: 31 mEq/L (ref 19–32)
Calcium: 10.1 mg/dL (ref 8.4–10.5)
Chloride: 101 mEq/L (ref 96–112)
Creatinine, Ser: 0.79 mg/dL (ref 0.40–1.20)
GFR: 84.05 mL/min (ref >60.00)
Glucose, Bld: 111 mg/dL — ABNORMAL HIGH (ref 70–99)
Potassium: 4 mEq/L (ref 3.5–5.1)
Sodium: 140 mEq/L (ref 135–145)
Total Bilirubin: 0.3 mg/dL (ref 0.2–1.2)
Total Protein: 6.8 g/dL (ref 6.0–8.3)

## 2021-05-20 LAB — CBC WITH DIFFERENTIAL/PLATELET
Basophils Absolute: 0 10*3/uL (ref 0.0–0.1)
Basophils Relative: 0.6 % (ref 0.0–3.0)
Eosinophils Absolute: 0.1 10*3/uL (ref 0.0–0.7)
Eosinophils Relative: 1.9 % (ref 0.0–5.0)
HCT: 41.8 % (ref 36.0–46.0)
Hemoglobin: 13.8 g/dL (ref 12.0–15.0)
Lymphocytes Relative: 20.6 % (ref 12.0–46.0)
Lymphs Abs: 1.4 10*3/uL (ref 0.7–4.0)
MCHC: 33.1 g/dL (ref 30.0–36.0)
MCV: 87.2 fl (ref 78.0–100.0)
Monocytes Absolute: 0.4 10*3/uL (ref 0.1–1.0)
Monocytes Relative: 6 % (ref 3.0–12.0)
Neutro Abs: 4.7 10*3/uL (ref 1.4–7.7)
Neutrophils Relative %: 70.9 % (ref 43.0–77.0)
Platelets: 290 10*3/uL (ref 150.0–400.0)
RBC: 4.79 Mil/uL (ref 3.87–5.11)
RDW: 13.6 % (ref 11.5–15.5)
WBC: 6.7 10*3/uL (ref 4.0–10.5)

## 2021-05-20 LAB — TSH: TSH: 3.7 u[IU]/mL (ref 0.35–5.50)

## 2021-05-21 LAB — EPSTEIN-BARR VIRUS VCA ANTIBODY PANEL
EBV NA IgG: >600 U/mL — ABNORMAL HIGH
EBV VCA IgG: >750 U/mL — ABNORMAL HIGH
EBV VCA IgM: <36 U/mL

## 2021-06-10 ENCOUNTER — Other Ambulatory Visit: Payer: Self-pay | Admitting: Family Medicine

## 2021-06-10 DIAGNOSIS — F418 Other specified anxiety disorders: Secondary | ICD-10-CM

## 2021-06-17 ENCOUNTER — Ambulatory Visit: Payer: BC Managed Care – PPO | Admitting: Family Medicine

## 2021-06-17 ENCOUNTER — Encounter: Payer: Self-pay | Admitting: Family Medicine

## 2021-06-17 VITALS — BP 136/69 | HR 79 | Temp 98.2°F | Resp 12 | Ht 62.0 in | Wt 129.8 lb

## 2021-06-17 DIAGNOSIS — S0502XA Injury of conjunctiva and corneal abrasion without foreign body, left eye, initial encounter: Secondary | ICD-10-CM

## 2021-06-17 MED ORDER — MOXIFLOXACIN HCL 0.5 % OP SOLN: 1.0000 [drp] | Freq: Three times a day (TID) | OPHTHALMIC | 0 refills | Status: DC

## 2021-06-17 NOTE — Patient Instructions (Signed)
Corneal Abrasion ?A corneal abrasion is a scratch or injury to the clear covering over the front of the eye (cornea). Your cornea forms a clear dome that protects your eye and helps to focus your vision. Your cornea is made up of many layers, but the surface layer is one of the most sensitive tissues in your body. A corneal abrasion can be very painful. ?If a corneal abrasion is not treated, it can become infected and cause an ulcer. This can lead to scarring. A scarred cornea can affect your vision. Sometimes abrasions come back in the same area, even after the original injury has healed. ?What are the causes? ?This condition may be caused by: ?A poke in the eye. ?A gritty or irritating substance (foreign body) in the eye. ?Excessive eye rubbing. ?Very dry eyes. ?Certain eye infections. ?Contact lenses that fit poorly or are worn for a long period of time. You can also injure your cornea when putting contact lenses in your eye or taking them out. ?Eye surgery. ?Certain cornea problems may increase the chance of a corneal abrasion. ?Sometimes, the cause is not known. ?What are the signs or symptoms? ?Symptoms of this condition include: ?Eye pain. The pain may get worse when you open and close your eye or when you move your eye. ?A feeling of something stuck in your eye. ?Tearing, redness, and sensitivity to light. ?Having trouble keeping your eye open, or not being able to keep it open. ?Blurred vision. ?Headache. ?How is this diagnosed? ?You may work with a health care provider who specializes in diseases and conditions of the eye (ophthalmologist). This condition may be diagnosed based on your medical history, symptoms, and an eye exam. ?Before the eye exam, numbing drops may be put into your eye. You may also have dye put in your eye with a dropper or a small paper strip. The dye makes the abrasion easy to see when your ophthalmologist examines your eye with a light. Your ophthalmologist may look at your eye  through an eye scope (slit lamp). ?How is this treated? ?Treatment may vary depending on the cause of your condition, and it may include: ?Washing out your eye. ?Removing any foreign bodies that are in your eye. ?Using antibiotic drops or ointment to treat or prevent an infection. ?Using a dilating drop to decrease inflammation and pain. ?Using steroid drops or ointment to treat redness, irritation, or inflammation. ?Applying a cold, wet cloth (cold compress) or ice pack to ease the pain. ?Taking pain medicine by mouth (orally). ?In some cases, an eye patch or bandage soft contact lens might also be used. An eye patch should not be used if the corneal abrasion was related to contact lens wear as it can increase the chance of infection in these eyes. ?Follow these instructions at home: ?Medicines ?Use eye drops or ointments as told by your health care provider. ?If you were prescribed antibiotic drops or ointment, use them as told by your health care provider. Do not stop using the antibiotic even if you start to feel better. ?Take over-the-counter and prescription medicines only as told by your health care provider. ?Ask your health care provider if the medicine prescribed to you: ?Requires you to avoid driving or using heavy machinery. ?Can cause constipation. You may need to take these actions to prevent or treat constipation: ?Drink enough fluid to keep your urine pale yellow. ?Take over-the-counter or prescription medicines. ?Eat foods that are high in fiber, such as beans, whole grains, and fresh  fruits and vegetables. ?Limit foods that are high in fat and processed sugars, such as fried or sweet foods. ?Eye patch use ?If you have an eye patch, wear it as told by your health care provider. ?Do not drive or use machinery while wearing an eye patch. Your ability to judge distances will be impaired. ?Follow instructions from your health care provider about when to remove the patch. ?General instructions ?Ask your  health care provider whether you can use a cold compress on your eye to relieve pain. ?Do not rub or touch your eye. Do not wash out your eye. ?Do not wear contact lenses until your health care provider says that this is okay. ?Avoid bright light and eye strain. ?Keep all follow-up visits as told by your health care provider. This is important for preventing infection and vision loss. ?Contact a health care provider if: ?You continue to have eye pain and other symptoms for more than 2 days. ?You have new symptoms, such as worse redness, tearing, or discharge. ?You have discharge that makes your eyelids stick together in the morning. ?Your eye patch becomes so loose that you can blink your eye. ?Symptoms return after the original abrasion has healed. ?Get help right away if: ?You have severe eye pain that does not get better with medicine. ?You have vision loss. ?Summary ?A corneal abrasion is a scratch or injury to the clear covering over the front of the eye (cornea). ?It is important to get treatment for a corneal abrasion. If this problem is not treated, it can affect your vision. ?Use eye drops or ointments as told by your health care provider. ?If you have an eye patch, do not drive or use machinery while wearing it. Your ability to judge distances will be impaired. ?Let your health care provider know if your symptoms continue for more than 2 days. ?This information is not intended to replace advice given to you by your health care provider. Make sure you discuss any questions you have with your health care provider. ?Document Revised: 07/01/2018 Document Reviewed: 07/01/2018 ?Elsevier Patient Education ? [employer]. ? ?

## 2021-06-17 NOTE — Progress Notes (Signed)
? ?Established Patient Office Visit ? ?Subjective:  ?Patient ID: Emily Kirby, female    DOB: 12-25-1965  Age: 56 y.o. MRN: [id number] ? ?CC:  ?Chief Complaint  ?Patient presents with  ? left eye pain, red, and pressure  ? ? ?HPI ?Emily Kirby presents for L painful , red eye since Monday am with pressure.  It comes and goes but the redness is there all the time ---no pain at present  ? ?Past Medical History:  ?Diagnosis Date  ? AC (acromioclavicular) joint bone spurs   ? Allergy   ? SEASONAL  ? Chicken pox   ? Kidney stones   ? Uterine fibroid   ? ? ?Past Surgical History:  ?Procedure Laterality Date  ? CESAREAN SECTION    ? DILATION AND CURETTAGE OF UTERUS    ? LIPOSUCTION    ? ? ?Family History  ?Problem Relation Age of Onset  ? Hypertension Mother   ? Hyperlipidemia Mother   ? Cancer Father   ?     prostate  ? Hearing loss Father   ? COPD Father   ? Heart disease Father   ?     MI  ? Prostate cancer Father   ? Colon cancer Neg Hx   ? ? ?Social History  ? ?Socioeconomic History  ? Marital status: Married  ?  Spouse name: Not on file  ? Number of children: Not on file  ? Years of education: Not on file  ? Highest education level: Not on file  ?Occupational History  ?  Employer: [employer]  ?  Comment: court interpreter  ?Tobacco Use  ? Smoking status: Never  ? Smokeless tobacco: Never  ?Substance and Sexual Activity  ? Alcohol use: Yes  ?  Alcohol/week: 7.0 standard drinks  ?  Types: 7 Shots of liquor per week  ?  Comment: 1 shot a night  ? Drug use: No  ? Sexual activity: Yes  ?  Partners: Male  ?Other Topics Concern  ? Not on file  ?Social History Narrative  ? Plays tennis and goes to gym for exercise classes   ? ?Social Determinants of Health  ? ?Financial Resource Strain: Not on file  ?Food Insecurity: Not on file  ?Transportation Needs: Not on file  ?Physical Activity: Not on file  ?Stress: Not on file  ?Social Connections: Not on file  ?Intimate Partner Violence: Not on file   ? ? ?Outpatient Medications Prior to Visit  ?Medication Sig Dispense Refill  ? cetirizine (ZYRTEC) 10 MG tablet Take 10 mg by mouth daily.    ? desvenlafaxine (PRISTIQ) 50 MG 24 hr tablet Take 1 tablet (50 mg total) by mouth daily. (Patient not taking: Reported on 06/17/2021) 30 tablet 3  ? Norethin Ace-Eth Estrad-FE 1-20 MG-MCG(24) CAPS Take 1 capsule by mouth daily.    ? cefdinir (OMNICEF) 300 MG capsule Take 1 capsule (300 mg total) by mouth 2 (two) times daily. 20 capsule 0  ? fluticasone (FLONASE) 50 MCG/ACT nasal spray PLACE 1-2 SPRAYS INTO BOTH NOSTRILS DAILY. 16 mL 2  ? ?No facility-administered medications prior to visit.  ? ? ?Allergies  ?Allergen Reactions  ? Codeine Other (See Comments)  ?  Stomach upset  ? Pseudoephedrine Other (See Comments)  ?  Tingling all over body  ? ? ?ROS ?Review of Systems  ?Constitutional:  Negative for activity change, appetite change, chills, diaphoresis, fatigue, fever and unexpected weight change.  ?Eyes:  Negative for pain, redness and  visual disturbance.  ?Respiratory:  Negative for cough, chest tightness, shortness of breath and wheezing.   ?Cardiovascular:  Negative for chest pain, palpitations and leg swelling.  ?Gastrointestinal:  Negative for abdominal distention and abdominal pain.  ?Endocrine: Negative for cold intolerance, heat intolerance, polydipsia, polyphagia and polyuria.  ?Genitourinary:  Positive for dysuria, frequency and urgency. Negative for difficulty urinating, dyspareunia, flank pain, genital sores, hematuria, menstrual problem, pelvic pain, vaginal discharge and vaginal pain.  ?Musculoskeletal:  Negative for back pain.  ?Neurological:  Negative for dizziness, light-headedness, numbness and headaches.  ? ?  ?Objective:  ?  ?Physical Exam ?Vitals and nursing note reviewed.  ?Constitutional:   ?   Appearance: She is well-developed.  ?HENT:  ?   Head: Normocephalic and atraumatic.  ?Eyes:  ?   Conjunctiva/sclera: Conjunctivae normal.  ? ?   Comments: +  fluro with woods lamp---  + abrasion  ?No fb   ?Neck:  ?   Thyroid: No thyromegaly.  ?   Vascular: No carotid bruit or JVD.  ?Cardiovascular:  ?   Rate and Rhythm: Normal rate and regular rhythm.  ?   Heart sounds: Normal heart sounds. No murmur heard. ?Pulmonary:  ?   Effort: Pulmonary effort is normal. No respiratory distress.  ?   Breath sounds: Normal breath sounds. No wheezing or rales.  ?Chest:  ?   Chest wall: No tenderness.  ?Musculoskeletal:  ?   Cervical back: Normal range of motion and neck supple.  ?Neurological:  ?   Mental Status: She is alert and oriented to person, place, and time.  ? ?BP 136/69 (BP Location: Left Arm, Cuff Size: Normal)   Pulse 79   Temp 98.2 ?F (36.8 ?C) (Oral)   Resp 12   Ht '5\' 2"'$  (1.575 m)   Wt 129 lb 12.8 oz (58.9 kg)   SpO2 99%   BMI 23.74 kg/m?  ?Wt Readings from Last 3 Encounters:  ?06/17/21 129 lb 12.8 oz (58.9 kg)  ?05/19/21 123 lb 9.6 oz (56.1 kg)  ?12/02/20 122 lb 6.4 oz (55.5 kg)  ? ? ? ?Health Maintenance Due  ?Topic Date Due  ? HIV Screening  Never done  ? COVID-19 Vaccine (4 - Booster for Pfizer series) 02/29/2020  ? ? ?There are no preventive care reminders to display for this patient. ? ?Lab Results  ?Component Value Date  ? TSH 3.70 05/19/2021  ? ?Lab Results  ?Component Value Date  ? WBC 6.7 05/19/2021  ? HGB 13.8 05/19/2021  ? HCT 41.8 05/19/2021  ? MCV 87.2 05/19/2021  ? PLT 290.0 05/19/2021  ? ?Lab Results  ?Component Value Date  ? NA 140 05/19/2021  ? K 4.0 05/19/2021  ? CO2 31 05/19/2021  ? GLUCOSE 111 (H) 05/19/2021  ? BUN 12 05/19/2021  ? CREATININE 0.79 05/19/2021  ? BILITOT 0.3 05/19/2021  ? ALKPHOS 57 05/19/2021  ? AST 18 05/19/2021  ? ALT 18 05/19/2021  ? PROT 6.8 05/19/2021  ? ALBUMIN 4.4 05/19/2021  ? CALCIUM 10.1 05/19/2021  ? GFR 84.05 05/19/2021  ? ?Lab Results  ?Component Value Date  ? CHOL 199 03/27/2021  ? ?Lab Results  ?Component Value Date  ? HDL 55.50 03/27/2021  ? ?Lab Results  ?Component Value Date  ? LDLCALC 119 (H) 03/27/2021   ? ?Lab Results  ?Component Value Date  ? TRIG 124.0 03/27/2021  ? ?Lab Results  ?Component Value Date  ? CHOLHDL 4 03/27/2021  ? ?No results found for: HGBA1C ? ?  ?Assessment &  Plan:  ? ?Problem List Items Addressed This Visit   ?None ?Visit Diagnoses   ? ? Abrasion of left cornea, initial encounter    -  Primary  ? Relevant Medications  ? moxifloxacin (VIGAMOX) 0.5 % ophthalmic solution  ? ?  ?Compresses ?Abx drops  ?Optho if no better in 3-4 days  ? ?Meds ordered this encounter  ?Medications  ? moxifloxacin (VIGAMOX) 0.5 % ophthalmic solution  ?  Sig: Place 1 drop into the left eye 3 (three) times daily.  ?  Dispense:  3 mL  ?  Refill:  0  ? ? ?Follow-up: Return if symptoms worsen or fail to improve.  ? ? ?[name], DO ?

## 2021-06-19 ENCOUNTER — Telehealth: Payer: Self-pay | Admitting: Family Medicine

## 2021-06-19 DIAGNOSIS — S0502XA Injury of conjunctiva and corneal abrasion without foreign body, left eye, initial encounter: Secondary | ICD-10-CM

## 2021-06-19 NOTE — Telephone Encounter (Signed)
Okay to place referral

## 2021-06-19 NOTE — Telephone Encounter (Signed)
Patient states her eye has not gotten any better from her 04/11 OV. She would like to be referred to the ophthalmologist for further help. Please advise.  ?

## 2021-06-20 ENCOUNTER — Encounter: Payer: Self-pay | Admitting: Family Medicine

## 2021-06-20 DIAGNOSIS — S0502XA Injury of conjunctiva and corneal abrasion without foreign body, left eye, initial encounter: Secondary | ICD-10-CM

## 2021-06-20 NOTE — Telephone Encounter (Signed)
Urgent referral placed

## 2021-07-01 ENCOUNTER — Telehealth: Payer: Self-pay | Admitting: Family Medicine

## 2021-07-01 NOTE — Telephone Encounter (Signed)
[facility] called on behalf of the pt stating that the pt was feeling better and the referral needed to be cancelled. ?

## 2021-11-20 ENCOUNTER — Encounter: Payer: Self-pay | Admitting: Family Medicine

## 2021-11-20 ENCOUNTER — Ambulatory Visit: Payer: BC Managed Care – PPO | Admitting: Family Medicine

## 2021-11-20 VITALS — BP 124/80 | HR 85 | Temp 98.2°F | Resp 18 | Ht 62.0 in | Wt 129.2 lb

## 2021-11-20 DIAGNOSIS — H1031 Unspecified acute conjunctivitis, right eye: Secondary | ICD-10-CM

## 2021-11-20 DIAGNOSIS — F325 Major depressive disorder, single episode, in full remission: Secondary | ICD-10-CM

## 2021-11-20 DIAGNOSIS — R140 Abdominal distension (gaseous): Secondary | ICD-10-CM | POA: Diagnosis not present

## 2021-11-20 DIAGNOSIS — K219 Gastro-esophageal reflux disease without esophagitis: Secondary | ICD-10-CM | POA: Diagnosis not present

## 2021-11-20 LAB — CBC WITH DIFFERENTIAL/PLATELET
Basophils Absolute: 0 10*3/uL (ref 0.0–0.1)
Basophils Relative: 0.6 % (ref 0.0–3.0)
Eosinophils Absolute: 0 10*3/uL (ref 0.0–0.7)
Eosinophils Relative: 0.6 % (ref 0.0–5.0)
HCT: 41 % (ref 36.0–46.0)
Hemoglobin: 13.4 g/dL (ref 12.0–15.0)
Lymphocytes Relative: 11.4 % — ABNORMAL LOW (ref 12.0–46.0)
Lymphs Abs: 0.9 10*3/uL (ref 0.7–4.0)
MCHC: 32.8 g/dL (ref 30.0–36.0)
MCV: 84.7 fl (ref 78.0–100.0)
Monocytes Absolute: 0.2 10*3/uL (ref 0.1–1.0)
Monocytes Relative: 3.2 % (ref 3.0–12.0)
Neutro Abs: 6.3 10*3/uL (ref 1.4–7.7)
Neutrophils Relative %: 84.2 % — ABNORMAL HIGH (ref 43.0–77.0)
Platelets: 283 10*3/uL (ref 150.0–400.0)
RBC: 4.84 Mil/uL (ref 3.87–5.11)
RDW: 13.6 % (ref 11.5–15.5)
WBC: 7.5 10*3/uL (ref 4.0–10.5)

## 2021-11-20 LAB — COMPREHENSIVE METABOLIC PANEL
ALT: 21 U/L (ref 0–35)
AST: 21 U/L (ref 0–37)
Albumin: 4.2 g/dL (ref 3.5–5.2)
Alkaline Phosphatase: 51 U/L (ref 39–117)
BUN: 12 mg/dL (ref 6–23)
CO2: 26 mEq/L (ref 19–32)
Calcium: 9.5 mg/dL (ref 8.4–10.5)
Chloride: 104 mEq/L (ref 96–112)
Creatinine, Ser: 0.79 mg/dL (ref 0.40–1.20)
GFR: 83.75 mL/min (ref >60.00)
Glucose, Bld: 95 mg/dL (ref 70–99)
Potassium: 4.3 mEq/L (ref 3.5–5.1)
Sodium: 139 mEq/L (ref 135–145)
Total Bilirubin: 0.4 mg/dL (ref 0.2–1.2)
Total Protein: 7.1 g/dL (ref 6.0–8.3)

## 2021-11-20 LAB — H. PYLORI ANTIBODY, IGG: H Pylori IgG: NEGATIVE

## 2021-11-20 LAB — TSH: TSH: 1.26 u[IU]/mL (ref 0.35–5.50)

## 2021-11-20 MED ORDER — PANTOPRAZOLE SODIUM 40 MG PO TBEC: 40.0000 mg | DELAYED_RELEASE_TABLET | Freq: Every day | ORAL | 3 refills | Status: DC

## 2021-11-20 MED ORDER — MOXIFLOXACIN HCL 0.5 % OP SOLN: 1.0000 [drp] | Freq: Three times a day (TID) | OPHTHALMIC | 0 refills | Status: DC

## 2021-11-20 NOTE — Assessment & Plan Note (Signed)
vigamox gtts  May need to try allergy gtts if no relief  opth if no better

## 2021-11-20 NOTE — Assessment & Plan Note (Signed)
protonix daily  gerd HO given to the pt  Check labs

## 2021-11-20 NOTE — Progress Notes (Signed)
Subjective:   By signing my name below, I, Shehryar Baig, attest that this documentation has been prepared under the direction and in the presence of [name], DO  11/20/2021    Patient ID: Emily Kirby, female    DOB: 1965-10-29, 56 y.o.   MRN: [id number]  Chief Complaint  Patient presents with   GI Problem    Pt states having bloating and has gotten worse over the several months.    Eye Problem    X2 days, right eye, having redness, no pain.     GI Problem Primary symptoms do not include fever, abdominal pain, nausea, dysuria or rash.  Eye Problem  Associated symptoms include eye redness (right eye). Pertinent negatives include no blurred vision, fever or nausea.   Patient is in today for a office visit.   She complains of bloating which has worsened over the past couple of months. She also started experiencing reflux for the past couple of months. She thinks her bloating may have worsened recently due to stress. She is having stress due to her daughters health issues. She starting taking 150 mg Wellbutrin daily PO around March 2023 and reports her mood has not improvement much while taking it. She is seeing a counselor to help her and finds it is helping her come to terms. She has tried lexapro pristiq in the past to manage her mood and reports her symptoms have worsened while taking it. She notes her bloating started prior to her stress from her daughters health. She has not tried taking omeprazole or Prilosec and is willing to try either one. She follows up with a GYN specialist and is willing to follow up with her current symptoms with them during her next appointment. She does not know if she is currently in menopause due to taking birth control medication regularly.  She reports redness in her left eye for the past couple of days. She recently started Vigomax eye drops 2 days ago to manage her symptoms. She is interested in a refill as well.    Past Medical History:   Diagnosis Date   AC (acromioclavicular) joint bone spurs    Allergy    SEASONAL   Chicken pox    Kidney stones    Uterine fibroid     Past Surgical History:  Procedure Laterality Date   CESAREAN SECTION     DILATION AND CURETTAGE OF UTERUS     LIPOSUCTION      Family History  Problem Relation Age of Onset   Hypertension Mother    Hyperlipidemia Mother    Cancer Father        prostate   Hearing loss Father    COPD Father    Heart disease Father        MI   Prostate cancer Father    Colon cancer Neg Hx     Social History   Socioeconomic History   Marital status: Married    Spouse name: Not on file   Number of children: Not on file   Years of education: Not on file   Highest education level: Not on file  Occupational History    Employer: [employer]    Comment: court interpreter  Tobacco Use   Smoking status: Never   Smokeless tobacco: Never  Substance and Sexual Activity   Alcohol use: Yes    Alcohol/week: 7.0 standard drinks of alcohol    Types: 7 Shots of liquor per week  Comment: 1 shot a night   Drug use: No   Sexual activity: Yes    Partners: Male  Other Topics Concern   Not on file  Social History Narrative   Plays tennis and goes to gym for exercise classes    Social Determinants of Health   Financial Resource Strain: Not on file  Food Insecurity: Not on file  Transportation Needs: Not on file  Physical Activity: Not on file  Stress: Not on file  Social Connections: Not on file  Intimate Partner Violence: Not on file    Outpatient Medications Prior to Visit  Medication Sig Dispense Refill   buPROPion (WELLBUTRIN XL) 150 MG 24 hr tablet Take 150 mg by mouth daily.     cetirizine (ZYRTEC) 10 MG tablet Take 10 mg by mouth daily.     moxifloxacin (VIGAMOX) 0.5 % ophthalmic solution Place 1 drop into the left eye 3 (three) times daily. 3 mL 0   Norethin Ace-Eth Estrad-FE 1-20 MG-MCG(24) CAPS Take 1 capsule by mouth  daily.     desvenlafaxine (PRISTIQ) 50 MG 24 hr tablet Take 1 tablet (50 mg total) by mouth daily. (Patient not taking: Reported on 06/17/2021) 30 tablet 3   No facility-administered medications prior to visit.    Allergies  Allergen Reactions   Codeine Other (See Comments)    Stomach upset   Pseudoephedrine Other (See Comments)    Tingling all over body    Review of Systems  Constitutional:  Negative for fever and malaise/fatigue.  HENT:  Negative for congestion.   Eyes:  Positive for redness (right eye). Negative for blurred vision.  Respiratory:  Negative for shortness of breath.   Cardiovascular:  Negative for chest pain, palpitations and leg swelling.  Gastrointestinal:  Positive for heartburn. Negative for abdominal pain, blood in stool and nausea.       (+)bloating  Genitourinary:  Negative for dysuria and frequency.  Musculoskeletal:  Negative for falls.  Skin:  Negative for rash.  Neurological:  Negative for dizziness, loss of consciousness and headaches.  Endo/Heme/Allergies:  Negative for environmental allergies.  Psychiatric/Behavioral:  Positive for depression. The patient is nervous/anxious.        Objective:    Physical Exam Vitals and nursing note reviewed.  Constitutional:      General: She is not in acute distress.    Appearance: Normal appearance. She is not ill-appearing.  HENT:     Head: Normocephalic and atraumatic.     Right Ear: External ear normal.     Left Ear: External ear normal.  Eyes:     Extraocular Movements: Extraocular movements intact.     Pupils: Pupils are equal, round, and reactive to light.  Cardiovascular:     Rate and Rhythm: Normal rate and regular rhythm.     Heart sounds: Normal heart sounds. No murmur heard.    No gallop.  Pulmonary:     Effort: Pulmonary effort is normal. No respiratory distress.     Breath sounds: Normal breath sounds. No wheezing or rales.  Abdominal:     Palpations: Abdomen is soft.     Tenderness:  There is abdominal tenderness in the left lower quadrant.  Skin:    General: Skin is warm and dry.  Neurological:     Mental Status: She is alert and oriented to person, place, and time.  Psychiatric:        Mood and Affect: Affect is tearful.        Judgment: Judgment normal.  BP 124/80 (BP Location: Left Arm, Patient Position: Sitting, Cuff Size: Normal)   Pulse 85   Temp 98.2 F (36.8 C) (Oral)   Resp 18   Ht '5\' 2"'$  (1.575 m)   Wt 129 lb 3.2 oz (58.6 kg)   SpO2 98%   BMI 23.63 kg/m  Wt Readings from Last 3 Encounters:  11/20/21 129 lb 3.2 oz (58.6 kg)  06/17/21 129 lb 12.8 oz (58.9 kg)  05/19/21 123 lb 9.6 oz (56.1 kg)    Diabetic Foot Exam - Simple   No data filed    Lab Results  Component Value Date   WBC 6.7 05/19/2021   HGB 13.8 05/19/2021   HCT 41.8 05/19/2021   PLT 290.0 05/19/2021   GLUCOSE 111 (H) 05/19/2021   CHOL 199 03/27/2021   TRIG 124.0 03/27/2021   HDL 55.50 03/27/2021   LDLCALC 119 (H) 03/27/2021   ALT 18 05/19/2021   AST 18 05/19/2021   NA 140 05/19/2021   K 4.0 05/19/2021   CL 101 05/19/2021   CREATININE 0.79 05/19/2021   BUN 12 05/19/2021   CO2 31 05/19/2021   TSH 3.70 05/19/2021    Lab Results  Component Value Date   TSH 3.70 05/19/2021   Lab Results  Component Value Date   WBC 6.7 05/19/2021   HGB 13.8 05/19/2021   HCT 41.8 05/19/2021   MCV 87.2 05/19/2021   PLT 290.0 05/19/2021   Lab Results  Component Value Date   NA 140 05/19/2021   K 4.0 05/19/2021   CO2 31 05/19/2021   GLUCOSE 111 (H) 05/19/2021   BUN 12 05/19/2021   CREATININE 0.79 05/19/2021   BILITOT 0.3 05/19/2021   ALKPHOS 57 05/19/2021   AST 18 05/19/2021   ALT 18 05/19/2021   PROT 6.8 05/19/2021   ALBUMIN 4.4 05/19/2021   CALCIUM 10.1 05/19/2021   GFR 84.05 05/19/2021   Lab Results  Component Value Date   CHOL 199 03/27/2021   Lab Results  Component Value Date   HDL 55.50 03/27/2021   Lab Results  Component Value Date   LDLCALC 119 (H)  03/27/2021   Lab Results  Component Value Date   TRIG 124.0 03/27/2021   Lab Results  Component Value Date   CHOLHDL 4 03/27/2021   No results found for: "HGBA1C"     Assessment & Plan:   Problem List Items Addressed This Visit       Unprioritized   Gastroesophageal reflux disease without esophagitis - Primary    protonix daily  gerd HO given to the pt  Check labs       Relevant Medications   pantoprazole (PROTONIX) 40 MG tablet   Other Relevant Orders   US Abdomen Complete   CBC with Differential/Platelet   Comprehensive metabolic panel   H. pylori antibody, IgG   TSH   Depression    Pt is under a lot of stress due to her son's transitioning to woman And she is having a lot more reflux and abd bloating  Increase wellbutrin xl 300 mg daily       Relevant Medications   buPROPion (WELLBUTRIN XL) 150 MG 24 hr tablet   Acute conjunctivitis of right eye    vigamox gtts  May need to try allergy gtts if no relief  opth if no better      Relevant Medications   moxifloxacin (VIGAMOX) 0.5 % ophthalmic solution   Abdominal bloating   Relevant Orders   US Pelvic Complete With  Transvaginal   US Abdomen Complete   CBC with Differential/Platelet   Comprehensive metabolic panel   H. pylori antibody, IgG   TSH     Meds ordered this encounter  Medications   pantoprazole (PROTONIX) 40 MG tablet    Sig: Take 1 tablet (40 mg total) by mouth daily.    Dispense:  30 tablet    Refill:  3   moxifloxacin (VIGAMOX) 0.5 % ophthalmic solution    Sig: Place 1 drop into the right eye 3 (three) times daily.    Dispense:  3 mL    Refill:  0    I, [name], DO, personally preformed the services described in this documentation.  All medical record entries made by the scribe were at my direction and in my presence.  I have reviewed the chart and discharge instructions (if applicable) and agree that the record reflects my personal performance and is accurate and  complete. 11/20/2021   I,Shehryar Baig,acting as a scribe for [name], DO.,have documented all relevant documentation on the behalf of [name], DO,as directed by  [name], DO while in the presence of [name], DO.   [name], DO

## 2021-11-20 NOTE — Patient Instructions (Signed)
Food Choices for Gastroesophageal Reflux Disease, Adult When you have gastroesophageal reflux disease (GERD), the foods you eat and your eating habits are very important. Choosing the right foods can help ease the discomfort of GERD. Consider working with a dietitian to help you make healthy food choices. What are tips for following this plan? Reading food labels Look for foods that are low in saturated fat. Foods that have less than 5% of daily value (DV) of fat and 0 g of trans fats may help with your symptoms. Cooking Cook foods using methods other than frying. This may include baking, steaming, grilling, or broiling. These are all methods that do not need a lot of fat for cooking. To add flavor, try to use herbs that are low in spice and acidity. Meal planning  Choose healthy foods that are low in fat, such as fruits, vegetables, whole grains, low-fat dairy products, lean meats, fish, and poultry. Eat frequent, small meals instead of three large meals each day. Eat your meals slowly, in a relaxed setting. Avoid bending over or lying down until 2-3 hours after eating. Limit high-fat foods such as fatty meats or fried foods. Limit your intake of fatty foods, such as oils, butter, and shortening. Avoid the following as told by your health care provider: Foods that cause symptoms. These may be different for different people. Keep a food diary to keep track of foods that cause symptoms. Alcohol. Drinking large amounts of liquid with meals. Eating meals during the 2-3 hours before bed. Lifestyle Maintain a healthy weight. Ask your health care provider what weight is healthy for you. If you need to lose weight, work with your health care provider to do so safely. Exercise for at least 30 minutes on 5 or more days each week, or as told by your health care provider. Avoid wearing clothes that fit tightly around your waist and chest. Do not use any products that contain nicotine or tobacco. These  products include cigarettes, chewing tobacco, and vaping devices, such as e-cigarettes. If you need help quitting, ask your health care provider. Sleep with the head of your bed raised. Use a wedge under the mattress or blocks under the bed frame to raise the head of the bed. Chew sugar-free gum after mealtimes. What foods should I eat?  Eat a healthy, well-balanced diet of fruits, vegetables, whole grains, low-fat dairy products, lean meats, fish, and poultry. Each person is different. Foods that may trigger symptoms in one person may not trigger any symptoms in another person. Work with your health care provider to identify foods that are safe for you. The items listed above may not be a complete list of recommended foods and beverages. Contact a dietitian for more information. What foods should I avoid? Limiting some of these foods may help manage the symptoms of GERD. Everyone is different. Consult a dietitian or your health care provider to help you identify the exact foods to avoid, if any. Fruits Any fruits prepared with added fat. Any fruits that cause symptoms. For some people this may include citrus fruits, such as oranges, grapefruit, pineapple, and lemons. Vegetables Deep-fried vegetables. French fries. Any vegetables prepared with added fat. Any vegetables that cause symptoms. For some people, this may include tomatoes and tomato products, chili peppers, onions and garlic, and horseradish. Grains Pastries or quick breads with added fat. Meats and other proteins High-fat meats, such as fatty beef or pork, hot dogs, ribs, ham, sausage, salami, and bacon. Fried meat or protein, including   fried fish and fried chicken. Nuts and nut butters, in large amounts. Dairy Whole milk and chocolate milk. Sour cream. Cream. Ice cream. Cream cheese. Milkshakes. Fats and oils Butter. Margarine. Shortening. Ghee. Beverages Coffee and tea, with or without caffeine. Carbonated beverages. Sodas. Energy  drinks. Fruit juice made with acidic fruits, such as orange or grapefruit. Tomato juice. Alcoholic drinks. Sweets and desserts Chocolate and cocoa. Donuts. Seasonings and condiments Pepper. Peppermint and spearmint. Added salt. Any condiments, herbs, or seasonings that cause symptoms. For some people, this may include curry, hot sauce, or vinegar-based salad dressings. The items listed above may not be a complete list of foods and beverages to avoid. Contact a dietitian for more information. Questions to ask your health care provider Diet and lifestyle changes are usually the first steps that are taken to manage symptoms of GERD. If diet and lifestyle changes do not improve your symptoms, talk with your health care provider about taking medicines. Where to find more information [employer] G[employer]intestinal [employer]: aboutgerd.org Summary When you have gastroesophageal reflux disease (GERD), food and lifestyle choices may be very helpful in easing the discomfort of GERD. Eat frequent, small meals instead of three large meals each day. Eat your meals slowly, in a relaxed setting. Avoid bending over or lying down until 2-3 hours after eating. Limit high-fat foods such as fatty meats or fried foods. This information is not intended to replace advice given to you by your health care provider. Make sure you discuss any questions you have with your health care provider. Document Revised: 09/04/2019 Document Reviewed: 09/04/2019 Elsevier Patient Education  2023 Elsevier Inc.  

## 2021-11-20 NOTE — Assessment & Plan Note (Signed)
Pt is under a lot of stress due to her son's transitioning to woman And she is having a lot more reflux and abd bloating  Increase wellbutrin xl 300 mg daily

## 2021-11-22 ENCOUNTER — Encounter (HOSPITAL_BASED_OUTPATIENT_CLINIC_OR_DEPARTMENT_OTHER): Payer: Self-pay

## 2021-11-22 ENCOUNTER — Ambulatory Visit (HOSPITAL_BASED_OUTPATIENT_CLINIC_OR_DEPARTMENT_OTHER): Payer: BC Managed Care – PPO

## 2021-11-24 ENCOUNTER — Telehealth (HOSPITAL_BASED_OUTPATIENT_CLINIC_OR_DEPARTMENT_OTHER): Payer: Self-pay

## 2021-12-01 ENCOUNTER — Encounter: Payer: Self-pay | Admitting: Family Medicine

## 2021-12-04 ENCOUNTER — Encounter: Payer: Self-pay | Admitting: Family Medicine

## 2021-12-04 ENCOUNTER — Ambulatory Visit (INDEPENDENT_AMBULATORY_CARE_PROVIDER_SITE_OTHER): Payer: BC Managed Care – PPO | Admitting: Family Medicine

## 2021-12-04 VITALS — BP 120/70 | HR 89 | Temp 98.2°F | Resp 18 | Ht 62.0 in | Wt 128.8 lb

## 2021-12-04 DIAGNOSIS — F418 Other specified anxiety disorders: Secondary | ICD-10-CM | POA: Diagnosis not present

## 2021-12-04 DIAGNOSIS — Z0000 Encounter for general adult medical examination without abnormal findings: Secondary | ICD-10-CM

## 2021-12-04 DIAGNOSIS — Z23 Encounter for immunization: Secondary | ICD-10-CM

## 2021-12-04 DIAGNOSIS — E785 Hyperlipidemia, unspecified: Secondary | ICD-10-CM

## 2021-12-04 MED ORDER — BUPROPION HCL ER (XL) 300 MG PO TB24: 300.0000 mg | ORAL_TABLET | Freq: Every day | ORAL | 3 refills | Status: DC

## 2021-12-04 NOTE — Assessment & Plan Note (Signed)
ghm utd Check labs  See avs  

## 2021-12-04 NOTE — Progress Notes (Signed)
Subjective:   By signing my name below, I, [name], attest that this documentation has been prepared under the direction and in the presence of [name] DO 12/04/2021   Patient ID: Emily Kirby, female    DOB: 14-Mar-1965, 56 y.o.   MRN: [id number]  Chief Complaint  Patient presents with   Annual Exam    HPI Patient is in today for a comprehensive physical exam  She reports that her depressed mood is improving but she occasionally becomes anxious. She is currently taking 300 Mg of Wellbutrin XL and would like to continue the medication.  She reports that her bloating is on and off. Overall, she states that she is fine. She is maintaining her diet. She denies of urinary symptoms. She reports that she is scheduled for an ultrasound at [facility]   She denies having any fever, new muscle pain, joint pain , new moles, congestion, sinus pain, sore throat, chest pain, palpations, cough, SOB ,wheezing,n/v/d constipation, blood in stool, dysuria, frequency, hematuria, at this time  She denies of any changes to her family medical history. She reports no recent surgeries. Colonoscopy last completed on 06/29/2016 Pap Smear last completed on 10/04/2014 Mammogram last completed on 03/20/2021 She is interested in receiving her influenza vaccine during today's visit. She has received about four Covid-19 vaccines.  Past Medical History:  Diagnosis Date   AC (acromioclavicular) joint bone spurs    Allergy    SEASONAL   Chicken pox    Kidney stones    Uterine fibroid     Past Surgical History:  Procedure Laterality Date   CESAREAN SECTION     DILATION AND CURETTAGE OF UTERUS     LIPOSUCTION      Family History  Problem Relation Age of Onset   Hypertension Mother    Hyperlipidemia Mother    Cancer Father        prostate   Hearing loss Father    COPD Father    Heart disease Father        MI   Prostate cancer Father    Colon cancer Neg Hx     Social History    Socioeconomic History   Marital status: Married    Spouse name: Not on file   Number of children: Not on file   Years of education: Not on file   Highest education level: Not on file  Occupational History    Employer: [employer]    Comment: court interpreter  Tobacco Use   Smoking status: Never   Smokeless tobacco: Never  Substance and Sexual Activity   Alcohol use: Yes    Alcohol/week: 7.0 standard drinks of alcohol    Types: 7 Shots of liquor per week    Comment: 1 shot a night   Drug use: No   Sexual activity: Yes    Partners: Male  Other Topics Concern   Not on file  Social History Narrative   Plays tennis and goes to gym for exercise classes    Social Determinants of Health   Financial Resource Strain: Not on file  Food Insecurity: Not on file  Transportation Needs: Not on file  Physical Activity: Not on file  Stress: Not on file  Social Connections: Not on file  Intimate Partner Violence: Not on file    Outpatient Medications Prior to Visit  Medication Sig Dispense Refill   cetirizine (ZYRTEC) 10 MG tablet Take 10 mg by mouth daily.  moxifloxacin (VIGAMOX) 0.5 % ophthalmic solution Place 1 drop into the left eye 3 (three) times daily. 3 mL 0   moxifloxacin (VIGAMOX) 0.5 % ophthalmic solution Place 1 drop into the right eye 3 (three) times daily. 3 mL 0   Norethin Ace-Eth Estrad-FE 1-20 MG-MCG(24) CAPS Take 1 capsule by mouth daily.     pantoprazole (PROTONIX) 40 MG tablet Take 1 tablet (40 mg total) by mouth daily. 30 tablet 3   buPROPion (WELLBUTRIN XL) 150 MG 24 hr tablet Take 300 mg by mouth daily.     No facility-administered medications prior to visit.    Allergies  Allergen Reactions   Codeine Other (See Comments)    Stomach upset   Pseudoephedrine Other (See Comments)    Tingling all over body    Review of Systems  Constitutional:  Negative for fever and malaise/fatigue.  HENT:  Negative for congestion, sinus pain  and sore throat.   Eyes:  Negative for blurred vision.  Respiratory:  Negative for cough, shortness of breath and wheezing.   Cardiovascular:  Negative for chest pain, palpitations and leg swelling.  Gastrointestinal:  Negative for abdominal pain, blood in stool, constipation, diarrhea, nausea and vomiting.  Genitourinary:  Negative for dysuria, frequency and hematuria.  Musculoskeletal:  Negative for falls, joint pain and myalgias.  Skin:  Negative for rash.       (-) New Moles  Neurological:  Negative for dizziness, loss of consciousness and headaches.  Endo/Heme/Allergies:  Negative for environmental allergies.  Psychiatric/Behavioral:  Negative for depression. The patient is not nervous/anxious.        Objective:    Physical Exam Vitals and nursing note reviewed.  Constitutional:      General: She is not in acute distress.    Appearance: Normal appearance. She is not ill-appearing.  HENT:     Head: Normocephalic and atraumatic.     Right Ear: Tympanic membrane, ear canal and external ear normal.     Left Ear: Tympanic membrane, ear canal and external ear normal.  Eyes:     Extraocular Movements: Extraocular movements intact.     Pupils: Pupils are equal, round, and reactive to light.  Cardiovascular:     Rate and Rhythm: Normal rate and regular rhythm.     Heart sounds: Normal heart sounds. No murmur heard.    No gallop.  Pulmonary:     Effort: Pulmonary effort is normal. No respiratory distress.     Breath sounds: Normal breath sounds. No wheezing or rales.  Abdominal:     General: Bowel sounds are normal. There is no distension.     Palpations: Abdomen is soft.     Tenderness: There is no abdominal tenderness. There is no guarding.  Musculoskeletal:        General: No swelling or tenderness. Normal range of motion.  Skin:    General: Skin is warm and dry.  Neurological:     General: No focal deficit present.     Mental Status: She is alert and oriented to person,  place, and time.  Psychiatric:        Mood and Affect: Mood normal.        Behavior: Behavior normal.        Thought Content: Thought content normal.        Judgment: Judgment normal.     BP 120/70 (BP Location: Right Arm, Patient Position: Sitting, Cuff Size: Normal)   Pulse 89   Temp 98.2 F (36.8 C) (Oral)  Resp 18   Ht '5\' 2"'$  (1.575 m)   Wt 128 lb 12.8 oz (58.4 kg)   SpO2 98%   BMI 23.56 kg/m  Wt Readings from Last 3 Encounters:  12/04/21 128 lb 12.8 oz (58.4 kg)  11/20/21 129 lb 3.2 oz (58.6 kg)  06/17/21 129 lb 12.8 oz (58.9 kg)    Diabetic Foot Exam - Simple   No data filed    Lab Results  Component Value Date   WBC 7.5 11/20/2021   HGB 13.4 11/20/2021   HCT 41.0 11/20/2021   PLT 283.0 11/20/2021   GLUCOSE 95 11/20/2021   CHOL 199 03/27/2021   TRIG 124.0 03/27/2021   HDL 55.50 03/27/2021   LDLCALC 119 (H) 03/27/2021   ALT 21 11/20/2021   AST 21 11/20/2021   NA 139 11/20/2021   K 4.3 11/20/2021   CL 104 11/20/2021   CREATININE 0.79 11/20/2021   BUN 12 11/20/2021   CO2 26 11/20/2021   TSH 1.26 11/20/2021    Lab Results  Component Value Date   TSH 1.26 11/20/2021   Lab Results  Component Value Date   WBC 7.5 11/20/2021   HGB 13.4 11/20/2021   HCT 41.0 11/20/2021   MCV 84.7 11/20/2021   PLT 283.0 11/20/2021   Lab Results  Component Value Date   NA 139 11/20/2021   K 4.3 11/20/2021   CO2 26 11/20/2021   GLUCOSE 95 11/20/2021   BUN 12 11/20/2021   CREATININE 0.79 11/20/2021   BILITOT 0.4 11/20/2021   ALKPHOS 51 11/20/2021   AST 21 11/20/2021   ALT 21 11/20/2021   PROT 7.1 11/20/2021   ALBUMIN 4.2 11/20/2021   CALCIUM 9.5 11/20/2021   GFR 83.75 11/20/2021   Lab Results  Component Value Date   CHOL 199 03/27/2021   Lab Results  Component Value Date   HDL 55.50 03/27/2021   Lab Results  Component Value Date   LDLCALC 119 (H) 03/27/2021   Lab Results  Component Value Date   TRIG 124.0 03/27/2021   Lab Results  Component  Value Date   CHOLHDL 4 03/27/2021   No results found for: "HGBA1C"     Assessment & Plan:   Problem List Items Addressed This Visit       Unprioritized   Hyperlipidemia   Relevant Orders   Lipid panel   Preventative health care - Primary    ghm utd Check labs  See avs      Other Visit Diagnoses     Need for influenza vaccination       Relevant Orders   Flu Vaccine QUAD 91moIM (Fluarix, Fluzone & Alfiuria Quad PF) (Completed)   Depression with anxiety       Relevant Medications   buPROPion (WELLBUTRIN XL) 300 MG 24 hr tablet      Meds ordered this encounter  Medications   buPROPion (WELLBUTRIN XL) 300 MG 24 hr tablet    Sig: Take 1 tablet (300 mg total) by mouth daily.    Dispense:  90 tablet    Refill:  3    I, YAnn Held DO, personally preformed the services described in this documentation.  All medical record entries made by the scribe were at my direction and in my presence.  I have reviewed the chart and discharge instructions (if applicable) and agree that the record reflects my personal performance and is accurate and complete. 12/04/2021   I,Amber Collins,acting as a scribe for [employer] DO.,have  documented all relevant documentation on the behalf of [name], DO,as directed by  [name], DO while in the presence of [name], DO.    [name], DO

## 2021-12-04 NOTE — Patient Instructions (Signed)
Preventive Care 40-56 Years Old, Female Preventive care refers to lifestyle choices and visits with your health care provider that can promote health and wellness. Preventive care visits are also called wellness exams. What can I expect for my preventive care visit? Counseling Your health care provider may ask you questions about your: Medical history, including: Past medical problems. Family medical history. Pregnancy history. Current health, including: Menstrual cycle. Method of birth control. Emotional well-being. Home life and relationship well-being. Sexual activity and sexual health. Lifestyle, including: Alcohol, nicotine or tobacco, and drug use. Access to firearms. Diet, exercise, and sleep habits. Work and work environment. Sunscreen use. Safety issues such as seatbelt and bike helmet use. Physical exam Your health care provider will check your: Height and weight. These may be used to calculate your BMI (body mass index). BMI is a measurement that tells if you are at a healthy weight. Waist circumference. This measures the distance around your waistline. This measurement also tells if you are at a healthy weight and may help predict your risk of certain diseases, such as type 2 diabetes and high blood pressure. Heart rate and blood pressure. Body temperature. Skin for abnormal spots. What immunizations do I need?  Vaccines are usually given at various ages, according to a schedule. Your health care provider will recommend vaccines for you based on your age, medical history, and lifestyle or other factors, such as travel or where you work. What tests do I need? Screening Your health care provider may recommend screening tests for certain conditions. This may include: Lipid and cholesterol levels. Diabetes screening. This is done by checking your blood sugar (glucose) after you have not eaten for a while (fasting). Pelvic exam and Pap test. Hepatitis B test. Hepatitis C  test. HIV (human immunodeficiency virus) test. STI (sexually transmitted infection) testing, if you are at risk. Lung cancer screening. Colorectal cancer screening. Mammogram. Talk with your health care provider about when you should start having regular mammograms. This may depend on whether you have a family history of breast cancer. BRCA-related cancer screening. This may be done if you have a family history of breast, ovarian, tubal, or peritoneal cancers. Bone density scan. This is done to screen for osteoporosis. Talk with your health care provider about your test results, treatment options, and if necessary, the need for more tests. Follow these instructions at home: Eating and drinking  Eat a diet that includes fresh fruits and vegetables, whole grains, lean protein, and low-fat dairy products. Take vitamin and mineral supplements as recommended by your health care provider. Do not drink alcohol if: Your health care provider tells you not to drink. You are pregnant, may be pregnant, or are planning to become pregnant. If you drink alcohol: Limit how much you have to 0-1 drink a day. Know how much alcohol is in your drink. In the [place] one drink equals one 12 oz bottle of beer (355 mL), one 5 oz glass of wine (148 mL), or one 1 oz glass of hard liquor (44 mL). Lifestyle Brush your teeth every morning and night with fluoride toothpaste. Floss one time each day. Exercise for at least 30 minutes 5 or more days each week. Do not use any products that contain nicotine or tobacco. These products include cigarettes, chewing tobacco, and vaping devices, such as e-cigarettes. If you need help quitting, ask your health care provider. Do not use drugs. If you are sexually active, practice safe sex. Use a condom or other form of protection to   prevent STIs. If you do not wish to become pregnant, use a form of birth control. If you plan to become pregnant, see your health care provider for a  prepregnancy visit. Take aspirin only as told by your health care provider. Make sure that you understand how much to take and what form to take. Work with your health care provider to find out whether it is safe and beneficial for you to take aspirin daily. Find healthy ways to manage stress, such as: Meditation, yoga, or listening to music. Journaling. Talking to a trusted person. Spending time with friends and family. Minimize exposure to UV radiation to reduce your risk of skin cancer. Safety Always wear your seat belt while driving or riding in a vehicle. Do not drive: If you have been drinking alcohol. Do not ride with someone who has been drinking. When you are tired or distracted. While texting. If you have been using any mind-altering substances or drugs. Wear a helmet and other protective equipment during sports activities. If you have firearms in your house, make sure you follow all gun safety procedures. Seek help if you have been physically or sexually abused. What's next? Visit your health care provider once a year for an annual wellness visit. Ask your health care provider how often you should have your eyes and teeth checked. Stay up to date on all vaccines. This information is not intended to replace advice given to you by your health care provider. Make sure you discuss any questions you have with your health care provider. Document Revised: 08/21/2020 Document Reviewed: 08/21/2020 Elsevier Patient Education  [place].

## 2021-12-05 LAB — LIPID PANEL
Cholesterol: 225 mg/dL — ABNORMAL HIGH (ref 0–200)
HDL: 60.5 mg/dL (ref >39.00)
LDL Cholesterol: 150 mg/dL — ABNORMAL HIGH (ref 0–99)
NonHDL: 164.69
Total CHOL/HDL Ratio: 4
Triglycerides: 72 mg/dL (ref 0.0–149.0)
VLDL: 14.4 mg/dL (ref 0.0–40.0)

## 2021-12-08 ENCOUNTER — Encounter: Payer: BC Managed Care – PPO | Admitting: Family Medicine

## 2021-12-11 ENCOUNTER — Other Ambulatory Visit: Payer: Self-pay | Admitting: Family

## 2021-12-11 ENCOUNTER — Encounter: Payer: Self-pay | Admitting: Family Medicine

## 2021-12-15 ENCOUNTER — Other Ambulatory Visit: Payer: Self-pay | Admitting: Family

## 2021-12-15 MED ORDER — SIMVASTATIN 20 MG PO TABS: 20.0000 mg | ORAL_TABLET | Freq: Every day | ORAL | 3 refills | Status: DC

## 2022-01-05 ENCOUNTER — Other Ambulatory Visit: Payer: Self-pay

## 2022-01-05 ENCOUNTER — Encounter: Payer: Self-pay | Admitting: Family Medicine

## 2022-01-05 DIAGNOSIS — D259 Leiomyoma of uterus, unspecified: Secondary | ICD-10-CM

## 2022-02-02 ENCOUNTER — Other Ambulatory Visit: Payer: Self-pay | Admitting: Obstetrics and Gynecology

## 2022-02-02 DIAGNOSIS — Z1231 Encounter for screening mammogram for malignant neoplasm of breast: Secondary | ICD-10-CM

## 2022-02-02 LAB — HM PAP SMEAR: HM Pap smear: NORMAL

## 2022-02-09 ENCOUNTER — Encounter: Payer: Self-pay | Admitting: Family Medicine

## 2022-02-10 ENCOUNTER — Encounter: Payer: Self-pay | Admitting: Internal Medicine

## 2022-02-10 ENCOUNTER — Other Ambulatory Visit: Payer: Self-pay | Admitting: Family Medicine

## 2022-02-10 DIAGNOSIS — F418 Other specified anxiety disorders: Secondary | ICD-10-CM

## 2022-02-15 ENCOUNTER — Other Ambulatory Visit: Payer: Self-pay | Admitting: Family Medicine

## 2022-02-15 DIAGNOSIS — K219 Gastro-esophageal reflux disease without esophagitis: Secondary | ICD-10-CM

## 2022-02-18 ENCOUNTER — Ambulatory Visit: Payer: BC Managed Care – PPO | Admitting: Medical

## 2022-03-10 ENCOUNTER — Telehealth: Payer: Self-pay | Admitting: Internal Medicine

## 2022-03-10 NOTE — Telephone Encounter (Signed)
Patient has an upcoming appointment with Dr. Henrene Pastor on 1/18.  She said she recently had an ultrasound of the abdomen and pelvis done at [place] she thought the doctor would like to see.  She said [facility] told her that we should request the report.  The number for [facility] is [phone].  She said she would call back some time next week to see if we were able to get the report.  Thank you.

## 2022-03-11 NOTE — Telephone Encounter (Signed)
Spoke with patient and told her I was able to see the two reports in [place].  Patient was appreciative

## 2022-03-26 ENCOUNTER — Ambulatory Visit: Payer: BC Managed Care – PPO | Admitting: Internal Medicine

## 2022-03-26 ENCOUNTER — Ambulatory Visit
Admission: RE | Admit: 2022-03-26 | Discharge: 2022-03-26 | Disposition: A | Discharge status: Home / Self Care | Payer: BC Managed Care – PPO | Source: Ambulatory Visit | Attending: Obstetrics and Gynecology | Admitting: Obstetrics and Gynecology

## 2022-03-26 ENCOUNTER — Encounter: Payer: Self-pay | Admitting: Internal Medicine

## 2022-03-26 ENCOUNTER — Other Ambulatory Visit: Payer: BC Managed Care – PPO

## 2022-03-26 VITALS — BP 122/78 | HR 99 | Ht 62.0 in | Wt 128.0 lb

## 2022-03-26 DIAGNOSIS — R140 Abdominal distension (gaseous): Secondary | ICD-10-CM

## 2022-03-26 DIAGNOSIS — Z1231 Encounter for screening mammogram for malignant neoplasm of breast: Secondary | ICD-10-CM

## 2022-03-26 MED ORDER — RIFAXIMIN 550 MG PO TABS: 550.0000 mg | ORAL_TABLET | Freq: Three times a day (TID) | ORAL | 0 refills | Status: DC

## 2022-03-26 NOTE — Patient Instructions (Signed)
_______________________________________________________  If your blood pressure at your visit was 140/90 or greater, please contact your primary care physician to follow up on this.  _______________________________________________________  If you are age 57 or older, your body mass index should be between 23-30. Your Body mass index is 23.41 kg/m. If this is out of the aforementioned range listed, please consider follow up with your Primary Care Provider.  If you are age 88 or younger, your body mass index should be between 19-25. Your Body mass index is 23.41 kg/m. If this is out of the aformentioned range listed, please consider follow up with your Primary Care Provider.   ________________________________________________________  The [facility] providers would like to encourage you to use [facility] to communicate with providers for non-urgent requests or questions.  Due to long hold times on the telephone, sending your provider a message by [facility] may be a faster and more efficient way to get a response.  Please allow 48 business hours for a response.  Please remember that this is for non-urgent requests.  _______________________________________________________  Your provider has requested that you go to the basement level for lab work before leaving today. Press "B" on the elevator. The lab is located at the first door on the left as you exit the elevator.  We have sent the following medications to your pharmacy for you to pick up at your convenience:  Xifaxan

## 2022-03-26 NOTE — Progress Notes (Signed)
HISTORY OF PRESENT ILLNESS:  Emily Kirby is a 57 y.o. female, native of [place] court [place], who presents today for evaluation of 1 year history of problems with bloating.  I saw the patient in 2016 regarding globus type sensation.  Last seen January 23, 2015.  Patient.  For that complaint she underwent upper endoscopy in October 2016.  Exam was normal.  She was last seen in the facility April 2018 when she underwent routine screening colonoscopy.  She was found to have small hyperplastic polyps and internal hemorrhoids.  No other abnormalities.  Follow-up in 10 years recommended.  Patient tells me that about 10 months ago she noticed problems with postprandial bloating.  This occurs every day.  With most every meal.  Worse after heavy meals.  Her pant waistline is uncomfortable.  She has had some weight gain over time.  About 10+ pounds since 2016.  She states she has had a more liberal diet now incorporating more carbs.  She denies nausea or vomiting.  She describes her bowel habits as unchanged and regular.  Typically has a bowel movement daily.  Aside from eating, no exacerbating or relieving factors.  She did have evaluation with her gynecologist regarding the symptoms.  This included both an abdominal ultrasound as well as pelvic ultrasound.  Abdominal ultrasound was unremarkable.  Pelvic ultrasound revealed a large leiomyoma of the uterus.  This is being followed.  She also saw urology regarding microscopic hematuria.  As part of the evaluation she underwent a CT scan of the abdomen pelvis without contrast.  Noted to have a 3 mm stone in the bladder versus bladder wall calcification leiomyomatous uterus noted.  REVIEW OF SYSTEMS:  All non-GI ROS negative unless otherwise stated in the HPI except for some allergy trouble  Past Medical History:  Diagnosis Date   AC (acromioclavicular) joint bone spurs    Allergy    SEASONAL   Chicken pox    Kidney stones    Uterine fibroid     Past  Surgical History:  Procedure Laterality Date   CESAREAN SECTION     DILATION AND CURETTAGE OF UTERUS     LIPOSUCTION      Social History [name]  reports that she has never smoked. She has never used smokeless tobacco. She reports current alcohol use of about 7.0 standard drinks of alcohol per week. She reports that she does not use drugs.  family history includes COPD in her father; Cancer in her father; Hearing loss in her father; Heart disease in her father; Hyperlipidemia in her mother; Hypertension in her mother; Prostate cancer in her father.  Allergies  Allergen Reactions   Codeine Other (See Comments)    Stomach upset   Pseudoephedrine Other (See Comments)    Tingling all over body       PHYSICAL EXAMINATION: Vital signs: BP 122/78   Pulse 99   Ht '5\' 2"'$  (1.575 m)   Wt 128 lb (58.1 kg)   BMI 23.41 kg/m   Constitutional: generally well-appearing, no acute distress Psychiatric: alert and oriented x3, cooperative Eyes: extraocular movements intact, anicteric, conjunctiva pink Mouth: oral pharynx moist, no lesions Neck: supple no lymphadenopathy Cardiovascular: heart regular rate and rhythm, no murmur Lungs: clear to auscultation bilaterally Abdomen: soft, nontender, nondistended, no obvious ascites, no peritoneal signs, normal bowel sounds, no organomegaly Rectal: Omitted Extremities: no clubbing, cyanosis, or lower extremity edema bilaterally Skin: no lesions on visible extremities Neuro: No focal deficits.  Cranial nerves intact  ASSESSMENT:  1.  82-monthhistory of postprandial bloating.  No alarm features.  Consider celiac disease.  Consider small intestinal bacterial overgrowth.  Consider functional 2.  Colonoscopy April 2018 essentially normal, as described. 3.  Upper endoscopy October 2016.  Normal 4.  Recent CT and ultrasonographic examinations as outlined.  Large leiomyoma and clinically insignificant kidney stones.  Otherwise  unremarkable.   PLAN:  1.  Screening for celiac disease with tissue transglutaminase antibody IgA and serum IgA level 2.  Prescribe Xifaxan 550 mg p.o. 3 times daily x 2 weeks for possible SIBO 3.  GI office follow-up 6 weeks 4.  Routine follow-up screening colonoscopy 2028 5.  Continue follow-up with your gynecologist regarding uterine leiomyoma A total time of 45 minutes was spent with the patient, reviewing a myriad of outside studies and x-rays, obtaining comprehensive history, performing medically appropriate physical examination, counseling and educating the patient regarding the above listed issues, ordering blood work, ordering medication, and documenting clinical information in the health record

## 2022-03-27 LAB — IGA: Immunoglobulin A: 175 mg/dL (ref 47–310)

## 2022-03-27 LAB — TISSUE TRANSGLUTAMINASE, IGA: (tTG) Ab, IgA: <1 U/mL

## 2022-03-29 ENCOUNTER — Encounter: Payer: Self-pay | Admitting: Internal Medicine

## 2022-06-12 ENCOUNTER — Encounter: Payer: Self-pay | Admitting: Family Medicine

## 2022-06-12 ENCOUNTER — Ambulatory Visit: Payer: BC Managed Care – PPO | Admitting: Family Medicine

## 2022-06-12 VITALS — BP 136/74 | HR 90 | Temp 98.1°F | Ht 62.0 in | Wt 127.0 lb

## 2022-06-12 DIAGNOSIS — J069 Acute upper respiratory infection, unspecified: Secondary | ICD-10-CM

## 2022-06-12 DIAGNOSIS — J029 Acute pharyngitis, unspecified: Secondary | ICD-10-CM

## 2022-06-12 MED ORDER — AZITHROMYCIN 250 MG PO TABS: ORAL_TABLET | ORAL | 0 refills | Status: AC

## 2022-06-12 NOTE — Progress Notes (Signed)
Acute Office Visit  Subjective:     Patient ID: Emily Kirby, female    DOB: 7Talbert Forest/08/1965, 57 y.o.   MRN: [id number]  Chief Complaint  Patient presents with   Sore Throat     Patient is in today for sore throat.  Sore Throat: Patient complains of sore throat. Associated symptoms include sore throat and frontal headache .Onset of symptoms was 1 days ago, gradually worsening since that time. She is drinking plenty of fluids. She has not had recent close exposure to someone with proven streptococcal pharyngitis. Sore throat 8/10, worse with talking.   No fevers, chills, body aches, rhinorrhea, sneezing, coughing, ear pain, nausea, vomiting, diarrhea.  [place] are travelling to [place] on Sunday and she is afraid this may turn into a sinus infection.      ROS All review of systems negative except what is listed in the HPI      Objective:    BP 136/74   Pulse 90   Temp 98.1 F (36.7 C) (Oral)   Ht 5\' 2"  (1.575 m)   Wt 127 lb (57.6 kg)   SpO2 99%   BMI 23.23 kg/m    Physical Exam Vitals reviewed.  Constitutional:      Appearance: Normal appearance.  HENT:     Head: Normocephalic and atraumatic.     Right Ear: Tympanic membrane normal.     Left Ear: Tympanic membrane normal.     Nose: No congestion or rhinorrhea.     Mouth/Throat:     Mouth: Mucous membranes are moist.     Pharynx: Oropharynx is clear. Posterior oropharyngeal erythema present. No oropharyngeal exudate or uvula swelling.     Tonsils: No tonsillar exudate or tonsillar abscesses.  Cardiovascular:     Rate and Rhythm: Normal rate and regular rhythm.     Pulses: Normal pulses.     Heart sounds: Normal heart sounds.  Pulmonary:     Effort: Pulmonary effort is normal.     Breath sounds: Normal breath sounds.  Skin:    General: Skin is warm and dry.  Neurological:     Mental Status: She is alert and oriented to person, place, and time.  Psychiatric:        Mood and Affect: Mood normal.        Behavior:  Behavior normal.        Thought Content: Thought content normal.        Judgment: Judgment normal.        No results found for any visits on 06/12/22.      Assessment & Plan:   Problem List Items Addressed This Visit   None Visit Diagnoses     Viral URI    -  Primary   Relevant Medications   azithromycin (ZITHROMAX) 250 MG tablet   Sore throat       Relevant Medications   azithromycin (ZITHROMAX) 250 MG tablet      Strep test negative.  Likely viral URI or allergic related. She is afraid of it turning into a sinus infection while she is traveling - will send Zpak for her to take with her for worsening symptoms. Advised her to take another COVID test tomorrow before traveling.  Continue supportive measures including rest, hydration, humidifier use, steam showers, warm compresses to sinuses, warm liquids with lemon and honey, and over-the-counter cough, cold, and analgesics as needed.       Meds ordered this encounter  Medications   azithromycin (ZITHROMAX) 250 MG tablet  Sig: Take 2 tablets on day 1, then 1 tablet daily on days 2 through 5    Dispense:  6 tablet    Refill:  0    Order Specific Question:   Supervising Provider    Answer:   Danise Edge A [[id number]]    Return if symptoms worsen or fail to improve.  Clayborne Dana, NP

## 2022-06-22 ENCOUNTER — Encounter: Payer: Self-pay | Admitting: *Deleted

## 2022-12-14 ENCOUNTER — Encounter: Payer: Self-pay | Admitting: Family Medicine

## 2022-12-14 ENCOUNTER — Ambulatory Visit (INDEPENDENT_AMBULATORY_CARE_PROVIDER_SITE_OTHER): Payer: BC Managed Care – PPO | Admitting: Family Medicine

## 2022-12-14 VITALS — BP 130/80 | HR 80 | Temp 98.5°F | Resp 18 | Ht 62.0 in | Wt 127.6 lb

## 2022-12-14 DIAGNOSIS — Z0000 Encounter for general adult medical examination without abnormal findings: Secondary | ICD-10-CM | POA: Diagnosis not present

## 2022-12-14 DIAGNOSIS — F418 Other specified anxiety disorders: Secondary | ICD-10-CM

## 2022-12-14 DIAGNOSIS — E785 Hyperlipidemia, unspecified: Secondary | ICD-10-CM

## 2022-12-14 DIAGNOSIS — N912 Amenorrhea, unspecified: Secondary | ICD-10-CM | POA: Diagnosis not present

## 2022-12-14 LAB — TSH: TSH: 2.7 (ref 0.41–5.90)

## 2022-12-14 LAB — TESTOSTERONE: Testosterone: 8.2

## 2022-12-14 MED ORDER — SIMVASTATIN 20 MG PO TABS: 20.0000 mg | ORAL_TABLET | Freq: Every day | ORAL | 3 refills | Status: DC

## 2022-12-14 MED ORDER — BUPROPION HCL ER (XL) 300 MG PO TB24: 300.0000 mg | ORAL_TABLET | Freq: Every day | ORAL | 3 refills | Status: DC

## 2022-12-14 NOTE — Progress Notes (Signed)
Established Patient Office Visit  Subjective   Patient ID: Emily Kirby, female    DOB: [birth year]  Age: 57 y.o. MRN: [id number]  Chief Complaint  Patient presents with   Annual Exam    Pt states fasting     HPI Discussed the use of AI scribe software for clinical note transcription with the patient, who gave verbal consent to proceed.  History of Present Illness   The patient, with a history of hyperlipidemia and allergies, presents for medication refills and to discuss a calcium score. She reports that her husband recently had a calcium score and has been encouraging her to have one as well. She agrees to have the test done and the doctor places the order.  The patient also reports that she has been taking Zyrtec daily for allergies since her last visit in 2020. She believes that the daily Zyrtec has helped prevent her allergies from developing into something more severe. She currently has a hoarse voice, which she attributes to the dry weather.  The patient also mentions that she stopped taking birth control around May 13th of this year. Since stopping the birth control, she has noticed a decrease in the size of her belly, which she attributes to a fibroid that has been present for years. She has not had any periods since stopping the birth control.  Earlier this year, the patient had a kidney stone and underwent a CT scan. During the scan, the urologist noticed that her womb was enlarged, likely due to the fibroid. The patient reports that her belly has shrunk since she stopped taking birth control.      Patient Active Problem List   Diagnosis Date Noted   Preventative health care 12/04/2021   Gastroesophageal reflux disease without esophagitis 11/20/2021   Abdominal bloating 11/20/2021   Acute conjunctivitis of right eye 11/20/2021   Pansinusitis 05/19/2021   Other fatigue 05/19/2021   Hyperlipidemia 12/04/2019   Degenerative tear of triangular fibrocartilage complex (TFCC) of  right wrist 09/19/2019   Cough 03/27/2018   Pharyngitis 03/27/2018   Depression 03/23/2015   Scapular dysfunction 12/20/2014   Midepigastric pain 11/20/2014   Past Medical History:  Diagnosis Date   AC (acromioclavicular) joint bone spurs    Allergy    SEASONAL   Chicken pox    Kidney stones    Uterine fibroid    Past Surgical History:  Procedure Laterality Date   CESAREAN SECTION     DILATION AND CURETTAGE OF UTERUS     LIPOSUCTION     Social History   Tobacco Use   Smoking status: Never   Smokeless tobacco: Never  Vaping Use   Vaping status: Never Used  Substance Use Topics   Alcohol use: Yes    Alcohol/week: 7.0 standard drinks of alcohol    Types: 7 Shots of liquor per week    Comment: 4 drinks a week   Drug use: No   Social History   Socioeconomic History   Marital status: Married    Spouse name: Not on file   Number of children: 1   Years of education: Not on file   Highest education level: Not on file  Occupational History    Employer: [employer]    Comment: court interpreter  Tobacco Use   Smoking status: Never   Smokeless tobacco: Never  Vaping Use   Vaping status: Never Used  Substance and Sexual Activity   Alcohol use: Yes    Alcohol/week:  7.0 standard drinks of alcohol    Types: 7 Shots of liquor per week    Comment: 4 drinks a week   Drug use: No   Sexual activity: Yes    Partners: Male  Other Topics Concern   Not on file  Social History Narrative   Plays tennis and goes to gym for exercise classes    Social Determinants of Health   Financial Resource Strain: Not on file  Food Insecurity: Not on file  Transportation Needs: Not on file  Physical Activity: Not on file  Stress: Not on file  Social Connections: Not on file  Intimate Partner Violence: Not on file   Family Status  Relation Name Status   Mother  Alive   Father  Deceased at age 60       MI   Brother  (Not Specified)   Neg Hx  (Not Specified)   No partnership data on file   Family History  Problem Relation Age of Onset   Hypertension Mother    Hyperlipidemia Mother    Cancer Father        prostate   Hearing loss Father    COPD Father    Heart disease Father        MI   Prostate cancer Father    ADD / ADHD Brother    Colon cancer Neg Hx    Allergies  Allergen Reactions   Codeine Other (See Comments)    Stomach upset   Pseudoephedrine Other (See Comments)    Tingling all over body      Review of Systems  Constitutional:  Negative for chills, fever and malaise/fatigue.  HENT:  Negative for congestion and hearing loss.   Eyes:  Negative for blurred vision and discharge.  Respiratory:  Negative for cough, sputum production and shortness of breath.   Cardiovascular:  Negative for chest pain, palpitations and leg swelling.  Gastrointestinal:  Negative for abdominal pain, blood in stool, constipation, diarrhea, heartburn, nausea and vomiting.  Genitourinary:  Negative for dysuria, frequency, hematuria and urgency.  Musculoskeletal:  Negative for back pain, falls and myalgias.  Skin:  Negative for rash.  Neurological:  Negative for dizziness, sensory change, loss of consciousness, weakness and headaches.  Endo/Heme/Allergies:  Negative for environmental allergies. Does not bruise/bleed easily.  Psychiatric/Behavioral:  Negative for depression and suicidal ideas. The patient is not nervous/anxious and does not have insomnia.       Objective:     BP 130/80 (BP Location: Left Arm, Patient Position: Sitting, Cuff Size: Normal)   Pulse 80   Temp 98.5 F (36.9 C) (Oral)   Resp 18   Ht 5\' 2"  (1.575 m)   Wt 127 lb 9.6 oz (57.9 kg)   SpO2 98%   BMI 23.34 kg/m  BP Readings from Last 3 Encounters:  12/14/22 130/80  06/12/22 136/74  03/26/22 122/78   Wt Readings from Last 3 Encounters:  12/14/22 127 lb 9.6 oz (57.9 kg)  06/12/22 127 lb (57.6 kg)  03/26/22 128 lb (58.1 kg)   SpO2 Readings from Last 3 Encounters:   12/14/22 98%  06/12/22 99%  12/04/21 98%      Physical Exam Vitals and nursing note reviewed.  Constitutional:      General: She is not in acute distress.    Appearance: Normal appearance. She is well-developed.  HENT:     Head: Normocephalic and atraumatic.     Right Ear: Tympanic membrane, ear canal and external ear normal. There is  no impacted cerumen.     Left Ear: Tympanic membrane, ear canal and external ear normal. There is no impacted cerumen.     Nose: Nose normal.     Mouth/Throat:     Mouth: Mucous membranes are moist.     Pharynx: Oropharynx is clear. No oropharyngeal exudate or posterior oropharyngeal erythema.  Eyes:     General: No scleral icterus.       Right eye: No discharge.        Left eye: No discharge.     Conjunctiva/sclera: Conjunctivae normal.     Pupils: Pupils are equal, round, and reactive to light.  Neck:     Thyroid: No thyromegaly or thyroid tenderness.     Vascular: No JVD.  Cardiovascular:     Rate and Rhythm: Normal rate and regular rhythm.     Heart sounds: Normal heart sounds. No murmur heard. Pulmonary:     Effort: Pulmonary effort is normal. No respiratory distress.     Breath sounds: Normal breath sounds.  Abdominal:     General: Bowel sounds are normal. There is no distension.     Palpations: Abdomen is soft. There is no mass.     Tenderness: There is no abdominal tenderness. There is no guarding or rebound.  Genitourinary:    Vagina: Normal.  Musculoskeletal:        General: Normal range of motion.     Cervical back: Normal range of motion and neck supple.     Right lower leg: No edema.     Left lower leg: No edema.  Lymphadenopathy:     Cervical: No cervical adenopathy.  Skin:    General: Skin is warm and dry.     Findings: No erythema or rash.  Neurological:     Mental Status: She is alert and oriented to person, place, and time.     Cranial Nerves: No cranial nerve deficit.     Deep Tendon Reflexes: Reflexes are normal  and symmetric.  Psychiatric:        Mood and Affect: Mood normal.        Behavior: Behavior normal.        Thought Content: Thought content normal.        Judgment: Judgment normal.      No results found for any visits on 12/14/22.  Last CBC Lab Results  Component Value Date   WBC 7.5 11/20/2021   HGB 13.4 11/20/2021   HCT 41.0 11/20/2021   MCV 84.7 11/20/2021   MCH 28.4 11/14/2019   RDW 13.6 11/20/2021   PLT 283.0 11/20/2021   Last metabolic panel Lab Results  Component Value Date   GLUCOSE 95 11/20/2021   NA 139 11/20/2021   K 4.3 11/20/2021   CL 104 11/20/2021   CO2 26 11/20/2021   BUN 12 11/20/2021   CREATININE 0.79 11/20/2021   GFR 83.75 11/20/2021   CALCIUM 9.5 11/20/2021   PROT 7.1 11/20/2021   ALBUMIN 4.2 11/20/2021   BILITOT 0.4 11/20/2021   ALKPHOS 51 11/20/2021   AST 21 11/20/2021   ALT 21 11/20/2021   Last lipids Lab Results  Component Value Date   CHOL 225 (H) 12/04/2021   HDL 60.50 12/04/2021   LDLCALC 150 (H) 12/04/2021   TRIG 72.0 12/04/2021   CHOLHDL 4 12/04/2021   Last hemoglobin A1c No results found for: "HGBA1C" Last thyroid functions Lab Results  Component Value Date   TSH 1.26 11/20/2021   Last vitamin D No results found  for: "25OHVITD2", "25OHVITD3", "VD25OH" Last vitamin B12 and Folate Lab Results  Component Value Date   VITAMINB12 296 12/02/2020   FOLATE 15.5 12/02/2020      The 10-year ASCVD risk score (Arnett DK, et al., 2019) is: 2.6%    Assessment & Plan:   Problem List Items Addressed This Visit       Unprioritized   Preventative health care - Primary    Ghm utd Check labs  See AVS Health Maintenance  Topic Date Due   HIV Screening  Never done   Cervical Cancer Screening (HPV/Pap Cotest)  10/03/2017   COVID-19 Vaccine (4 - 2023-24 season) 11/08/2022   MAMMOGRAM  03/27/2023   Colonoscopy  06/30/2026   DTaP/Tdap/Td (3 - Td or Tdap) 11/21/2032   INFLUENZA VACCINE  Completed   Hepatitis C Screening   Completed   Zoster Vaccines- Shingrix  Completed   HPV VACCINES  Aged Out         Hyperlipidemia    Encourage heart healthy diet such as MIND or DASH diet, increase exercise, avoid trans fats, simple carbohydrates and processed foods, consider a krill or fish or flaxseed oil cap daily.        Relevant Medications   simvastatin (ZOCOR) 20 MG tablet   Other Relevant Orders   CT CARDIAC SCORING   Other Visit Diagnoses     Depression with anxiety       Relevant Medications   buPROPion (WELLBUTRIN XL) 300 MG 24 hr tablet   Amenorrhea       Relevant Orders   Hormone Panel     Assessment and Plan    Hyperlipidemia On Simvastatin for slightly elevated cholesterol levels. -Check cholesterol levels today to assess response to Simvastatin.  Menopause Stopped birth control in May 2024. No periods since then. -Run hormone tests today to confirm menopause.  Uterine Fibroid Noted significant decrease in abdominal size since stopping birth control, likely due to shrinkage of fibroid. -No specific plan discussed, continue monitoring.  General Health Maintenance -Order Calcium Score test as discussed. -Refill Bupropion and Simvastatin prescriptions. -Flu shot and Tdap vaccine already administered on 11/22/2022.        Return in about 1 year (around 12/14/2023), or if symptoms worsen or fail to improve, for fasting, annual exam.    Donato Schultz, DO

## 2022-12-14 NOTE — Assessment & Plan Note (Signed)
Encourage heart healthy diet such as MIND or DASH diet, increase exercise, avoid trans fats, simple carbohydrates and processed foods, consider a krill or fish or flaxseed oil cap daily.  °

## 2022-12-14 NOTE — Assessment & Plan Note (Signed)
Ghm utd Check labs  See AVS Health Maintenance  Topic Date Due   HIV Screening  Never done   Cervical Cancer Screening (HPV/Pap Cotest)  10/03/2017   COVID-19 Vaccine (4 - 2023-24 season) 11/08/2022   MAMMOGRAM  03/27/2023   Colonoscopy  06/30/2026   DTaP/Tdap/Td (3 - Td or Tdap) 11/21/2032   INFLUENZA VACCINE  Completed   Hepatitis C Screening  Completed   Zoster Vaccines- Shingrix  Completed   HPV VACCINES  Aged Out

## 2022-12-14 NOTE — Patient Instructions (Signed)
Preventive Care 40-57 Years Old, Female Preventive care refers to lifestyle choices and visits with your health care provider that can promote health and wellness. Preventive care visits are also called wellness exams. What can I expect for my preventive care visit? Counseling Your health care provider may ask you questions about your: Medical history, including: Past medical problems. Family medical history. Pregnancy history. Current health, including: Menstrual cycle. Method of birth control. Emotional well-being. Home life and relationship well-being. Sexual activity and sexual health. Lifestyle, including: Alcohol, nicotine or tobacco, and drug use. Access to firearms. Diet, exercise, and sleep habits. Work and work environment. Sunscreen use. Safety issues such as seatbelt and bike helmet use. Physical exam Your health care provider will check your: Height and weight. These may be used to calculate your BMI (body mass index). BMI is a measurement that tells if you are at a healthy weight. Waist circumference. This measures the distance around your waistline. This measurement also tells if you are at a healthy weight and may help predict your risk of certain diseases, such as type 2 diabetes and high blood pressure. Heart rate and blood pressure. Body temperature. Skin for abnormal spots. What immunizations do I need?  Vaccines are usually given at various ages, according to a schedule. Your health care provider will recommend vaccines for you based on your age, medical history, and lifestyle or other factors, such as travel or where you work. What tests do I need? Screening Your health care provider may recommend screening tests for certain conditions. This may include: Lipid and cholesterol levels. Diabetes screening. This is done by checking your blood sugar (glucose) after you have not eaten for a while (fasting). Pelvic exam and Pap test. Hepatitis B test. Hepatitis C  test. HIV (human immunodeficiency virus) test. STI (sexually transmitted infection) testing, if you are at risk. Lung cancer screening. Colorectal cancer screening. Mammogram. Talk with your health care provider about when you should start having regular mammograms. This may depend on whether you have a family history of breast cancer. BRCA-related cancer screening. This may be done if you have a family history of breast, ovarian, tubal, or peritoneal cancers. Bone density scan. This is done to screen for osteoporosis. Talk with your health care provider about your test results, treatment options, and if necessary, the need for more tests. Follow these instructions at home: Eating and drinking  Eat a diet that includes fresh fruits and vegetables, whole grains, lean protein, and low-fat dairy products. Take vitamin and mineral supplements as recommended by your health care provider. Do not drink alcohol if: Your health care provider tells you not to drink. You are pregnant, may be pregnant, or are planning to become pregnant. If you drink alcohol: Limit how much you have to 0-1 drink a day. Know how much alcohol is in your drink. In the [place] one drink equals one 12 oz bottle of beer (355 mL), one 5 oz glass of wine (148 mL), or one 1 oz glass of hard liquor (44 mL). Lifestyle Brush your teeth every morning and night with fluoride toothpaste. Floss one time each day. Exercise for at least 30 minutes 5 or more days each week. Do not use any products that contain nicotine or tobacco. These products include cigarettes, chewing tobacco, and vaping devices, such as e-cigarettes. If you need help quitting, ask your health care provider. Do not use drugs. If you are sexually active, practice safe sex. Use a condom or other form of protection to   prevent STIs. If you do not wish to become pregnant, use a form of birth control. If you plan to become pregnant, see your health care provider for a  prepregnancy visit. Take aspirin only as told by your health care provider. Make sure that you understand how much to take and what form to take. Work with your health care provider to find out whether it is safe and beneficial for you to take aspirin daily. Find healthy ways to manage stress, such as: Meditation, yoga, or listening to music. Journaling. Talking to a trusted person. Spending time with friends and family. Minimize exposure to UV radiation to reduce your risk of skin cancer. Safety Always wear your seat belt while driving or riding in a vehicle. Do not drive: If you have been drinking alcohol. Do not ride with someone who has been drinking. When you are tired or distracted. While texting. If you have been using any mind-altering substances or drugs. Wear a helmet and other protective equipment during sports activities. If you have firearms in your house, make sure you follow all gun safety procedures. Seek help if you have been physically or sexually abused. What's next? Visit your health care provider once a year for an annual wellness visit. Ask your health care provider how often you should have your eyes and teeth checked. Stay up to date on all vaccines. This information is not intended to replace advice given to you by your health care provider. Make sure you discuss any questions you have with your health care provider. Document Revised: 08/21/2020 Document Reviewed: 08/21/2020 Elsevier Patient Education  2024 [employer]  

## 2022-12-15 ENCOUNTER — Ambulatory Visit (HOSPITAL_BASED_OUTPATIENT_CLINIC_OR_DEPARTMENT_OTHER)
Admission: RE | Admit: 2022-12-15 | Discharge: 2022-12-15 | Disposition: A | Discharge status: Home / Self Care | Payer: BC Managed Care – PPO | Source: Ambulatory Visit | Attending: Family Medicine | Admitting: Family Medicine

## 2022-12-15 DIAGNOSIS — E785 Hyperlipidemia, unspecified: Secondary | ICD-10-CM | POA: Insufficient documentation

## 2022-12-29 LAB — HORMONE PANEL (T4,TSH,FSH,TESTT,SHBG,DHEA,ETC)
DHEA-Sulfate, LCMS: 27 ug/dL
Estradiol, Serum, MS: <1 pg/mL
Estrone Sulfate: <10 ng/dL
Follicle Stimulating Hormone: 113 m[IU]/mL
Free T-3: 3.6 pg/mL
Free Testosterone, Serum: 1.1 pg/mL
Progesterone, Serum: <10 ng/dL
Sex Hormone Binding Globulin: 38.9 nmol/L
T4: 7.2 ug/dL
TSH: 2.7 uU/mL
Testosterone, Serum (Total): 8.2 ng/dL
Testosterone-% Free: 1.3 %
Triiodothyronine (T-3), Serum: 106 ng/dL

## 2022-12-31 ENCOUNTER — Encounter: Payer: Self-pay | Admitting: Family Medicine

## 2023-01-01 ENCOUNTER — Other Ambulatory Visit: Payer: Self-pay | Admitting: Family Medicine

## 2023-01-01 DIAGNOSIS — E785 Hyperlipidemia, unspecified: Secondary | ICD-10-CM

## 2023-01-01 NOTE — Telephone Encounter (Signed)
Hyperlipidemia On Simvastatin for slightly elevated cholesterol levels. -Check cholesterol levels today to assess response to Simvastatin.  Lipid wasn't ordered or collected  that day. Please advise.

## 2023-01-07 ENCOUNTER — Other Ambulatory Visit (INDEPENDENT_AMBULATORY_CARE_PROVIDER_SITE_OTHER): Payer: BC Managed Care – PPO

## 2023-01-07 DIAGNOSIS — E785 Hyperlipidemia, unspecified: Secondary | ICD-10-CM

## 2023-01-07 LAB — CBC WITH DIFFERENTIAL/PLATELET
Basophils Absolute: 0 10*3/uL (ref 0.0–0.1)
Basophils Relative: 0.8 % (ref 0.0–3.0)
Eosinophils Absolute: 0.1 10*3/uL (ref 0.0–0.7)
Eosinophils Relative: 2.6 % (ref 0.0–5.0)
HCT: 42.3 % (ref 36.0–46.0)
Hemoglobin: 13.4 g/dL (ref 12.0–15.0)
Lymphocytes Relative: 42.8 % (ref 12.0–46.0)
Lymphs Abs: 2.1 10*3/uL (ref 0.7–4.0)
MCHC: 31.6 g/dL (ref 30.0–36.0)
MCV: 86 fL (ref 78.0–100.0)
Monocytes Absolute: 0.3 10*3/uL (ref 0.1–1.0)
Monocytes Relative: 7.1 % (ref 3.0–12.0)
Neutro Abs: 2.3 10*3/uL (ref 1.4–7.7)
Neutrophils Relative %: 46.7 % (ref 43.0–77.0)
Platelets: 274 10*3/uL (ref 150.0–400.0)
RBC: 4.92 Mil/uL (ref 3.87–5.11)
RDW: 13.4 % (ref 11.5–15.5)
WBC: 4.9 10*3/uL (ref 4.0–10.5)

## 2023-01-07 LAB — COMPREHENSIVE METABOLIC PANEL
ALT: 43 U/L — ABNORMAL HIGH (ref 0–35)
AST: 30 U/L (ref 0–37)
Albumin: 4.5 g/dL (ref 3.5–5.2)
Alkaline Phosphatase: 79 U/L (ref 39–117)
BUN: 15 mg/dL (ref 6–23)
CO2: 32 meq/L (ref 19–32)
Calcium: 9.7 mg/dL (ref 8.4–10.5)
Chloride: 104 meq/L (ref 96–112)
Creatinine, Ser: 0.84 mg/dL (ref 0.40–1.20)
GFR: 77.19 mL/min (ref >60.00)
Glucose, Bld: 86 mg/dL (ref 70–99)
Potassium: 4.4 meq/L (ref 3.5–5.1)
Sodium: 143 meq/L (ref 135–145)
Total Bilirubin: 0.3 mg/dL (ref 0.2–1.2)
Total Protein: 7 g/dL (ref 6.0–8.3)

## 2023-01-07 LAB — LIPID PANEL
Cholesterol: 183 mg/dL (ref 0–200)
HDL: 70.1 mg/dL (ref >39.00)
LDL Cholesterol: 99 mg/dL (ref 0–99)
NonHDL: 112.5
Total CHOL/HDL Ratio: 3
Triglycerides: 68 mg/dL (ref 0.0–149.0)
VLDL: 13.6 mg/dL (ref 0.0–40.0)

## 2023-01-07 LAB — TSH: TSH: 3.06 u[IU]/mL (ref 0.35–5.50)

## 2023-02-11 ENCOUNTER — Other Ambulatory Visit: Payer: Self-pay | Admitting: Obstetrics and Gynecology

## 2023-02-11 DIAGNOSIS — Z1231 Encounter for screening mammogram for malignant neoplasm of breast: Secondary | ICD-10-CM

## 2023-03-30 ENCOUNTER — Ambulatory Visit
Admission: RE | Admit: 2023-03-30 | Discharge: 2023-03-30 | Disposition: A | Discharge status: Home / Self Care | Payer: 59 | Source: Ambulatory Visit | Attending: Obstetrics and Gynecology | Admitting: Obstetrics and Gynecology

## 2023-03-30 DIAGNOSIS — Z1231 Encounter for screening mammogram for malignant neoplasm of breast: Secondary | ICD-10-CM

## 2023-08-19 ENCOUNTER — Ambulatory Visit: Payer: Self-pay

## 2023-08-19 ENCOUNTER — Ambulatory Visit (INDEPENDENT_AMBULATORY_CARE_PROVIDER_SITE_OTHER): Admitting: Family Medicine

## 2023-08-19 VITALS — BP 133/73 | HR 78 | Ht 62.0 in | Wt 126.0 lb

## 2023-08-19 DIAGNOSIS — M79645 Pain in left finger(s): Secondary | ICD-10-CM

## 2023-08-19 DIAGNOSIS — L989 Disorder of the skin and subcutaneous tissue, unspecified: Secondary | ICD-10-CM

## 2023-08-19 NOTE — Progress Notes (Signed)
 Acute Office Visit  Subjective:     Patient ID: Emily Kirby, female    DOB: Jan 26, 1966, 58 y.o.   MRN: [id number]  Chief Complaint  Patient presents with   Injury     Patient is in today for finger pain.   Discussed the use of AI scribe software for clinical note transcription with the patient, who gave verbal consent to proceed.  History of Present Illness   Emily Kirby is a 58 year old female who presents with swelling and bruising of the left finger.  She developed swelling and bruising of her left ring finger after holding a phone to her ear for about ten minutes.  She applied ice, used a CBD ointment, and took ibuprofen, yet proceeded to play a tennis match despite the discomfort. The finger was very swollen and painful, with difficulty bending it due to stiffness. The swelling was worse yesterday, preventing any bending, but today she can bend it slightly, though it remains stiff. The bruising has spread to the sides of the finger but overall less severe.   She has a history of easy bruising, with similar incidents occurring in her toes, which would turn purple without any remembered injury and resolve within a few days. Her fingers and toes are not normally cold, never numb/tingly.  For the current injury, she has been using ice, ibuprofen, and a CBD ointment. She also used elastic tape for compression and to avoid putting pressure on the injured finger while playing tennis. She has taken two ibuprofen before playing tennis and again today. She has been resting her hand and using ice and oil for relief.  Additionally, she has noticed two small bumps on her scalp - initially noticed by her hairdresser. She discovered them after washing her hair and is concerned about their significance. She has a history of going to bed with wet hair after sports activities but does not believe this is related to the bumps. She plans to monitor them for any changes.           ROS All  review of systems negative except what is listed in the HPI      Objective:    BP 133/73   Pulse 78   Ht 5' 2 (1.575 m)   Wt 126 lb (57.2 kg)   SpO2 100%   BMI 23.05 kg/m    Physical Exam Vitals reviewed.  Constitutional:      Appearance: Normal appearance.   Musculoskeletal:     Comments: Left 4th finger with mild edema and ecchymosis, no tenderness to palpation; ROM is normal other than mildly decreased flexion/grip due to edema, warm, cap refill <2 sec.   Skin:    Comments: <84mm flesh colored, mildly raised papule to right scalp, no discoloration or irregularities    Neurological:     Mental Status: She is alert and oriented to person, place, and time.   Psychiatric:        Mood and Affect: Mood normal.        Behavior: Behavior normal.        Thought Content: Thought content normal.        Judgment: Judgment normal.       At appointment:   At home this morning:   At home last night:    No results found for any visits on 08/19/23.      Assessment & Plan:   Problem List Items Addressed This Visit   None Visit Diagnoses  Scalp lesion    -  Primary     Pain of finger of left hand              Finger pain Swollen, bruised left finger with stiffness and pain. No bony tenderness. Possible strain.  Conservative treatment recommended. - Continue rest, ice, compression, ibuprofen for pain and inflammation. - Buddy tape during physical activity. - Monitor for improvement; consider sports medicine referral if symptoms persist or worsen. - Report new symptoms or lack of improvement.   Scalp lesions Two small scalp bumps, likely benign lesions.  Annual dermatological evaluations current - due again this fall. - Monitor with occasional photographs for changes. - Schedule follow-up with dermatologist in fall 2025 or sooner if changes occur.        No orders of the defined types were placed in this encounter.   Return if symptoms worsen or  fail to improve.  Everlina Hock, NP

## 2023-08-19 NOTE — Telephone Encounter (Signed)
  FYI Only or Action Required?: FYI only for provider  Patient was last seen in primary care on 12/14/2022 by Crecencio Dodge, Candida Chalk, DO. Called Nurse Triage reporting Hand Pain. Symptoms began yesterday. Interventions attempted: OTC medications: advil. Symptoms are: unchanged.  Triage Disposition: See PCP When Office is Open (Within 3 Days)  Patient/caregiver understands and will follow disposition?: Yes    Copied from CRM [id number]. Topic: Clinical - Red Word Triage >> Aug 19, 2023  7:38 AM [place] A wrote: [facility] that prompted transfer to Nurse Triage: Patient has possible sprained finger on left hand ring finger Reason for Disposition  [1] Swollen joint AND [2] no fever or redness  Answer Assessment - Initial Assessment Questions 1. ONSET: When did the pain start?      Yesterday, states playing tennis 2. LOCATION and RADIATION: Where is the pain located?  (e.g., fingertip, around nail, joint, entire  finger)      Left hand ring finger 3. SEVERITY: How bad is the pain? What does it keep you from doing?   (Scale 1-10; or mild, moderate, severe)  - MILD (1-3): doesn't interfere with normal activities.   - MODERATE (4-7): interferes with normal activities or awakens from sleep.  - SEVERE (8-10): excruciating pain, unable to hold a glass of water or bend finger even a little.     Hurts when she bends it 4. APPEARANCE: What does the finger look like? (e.g., redness, swelling, bruising, pallor)     Black, swollen 5. WORK OR EXERCISE: Has there been any recent work or exercise that involved this part (i.e., fingers or hand) of the body?     Plays tennis 6. CAUSE: What do you think is causing the pain?     unknown 7. AGGRAVATING FACTORS: What makes the pain worse? (e.g., using computer)     Bending finger 8. OTHER SYMPTOMS: Do you have any other symptoms? (e.g., fever, neck pain, numbness)     no 9. PREGNANCY: Is there any chance you are pregnant? When was your  last menstrual period?     na  Protocols used: Finger Pain-A-AH

## 2023-09-04 IMAGING — MG MM DIGITAL SCREENING BILAT W/ TOMO AND CAD
8 series · 9 of 24 positions shown · non-contrast
Comparison: Previous exam(s).

CLINICAL DATA: Screening.

EXAM:
DIGITAL SCREENING BILATERAL MAMMOGRAM WITH TOMOSYNTHESIS AND CAD
TECHNIQUE: Bilateral screening digital craniocaudal and mediolateral oblique
mammograms were obtained. Bilateral screening digital breast
tomosynthesis was performed. The images were evaluated with
computer-aided detection.

[R MLO synth-2D]
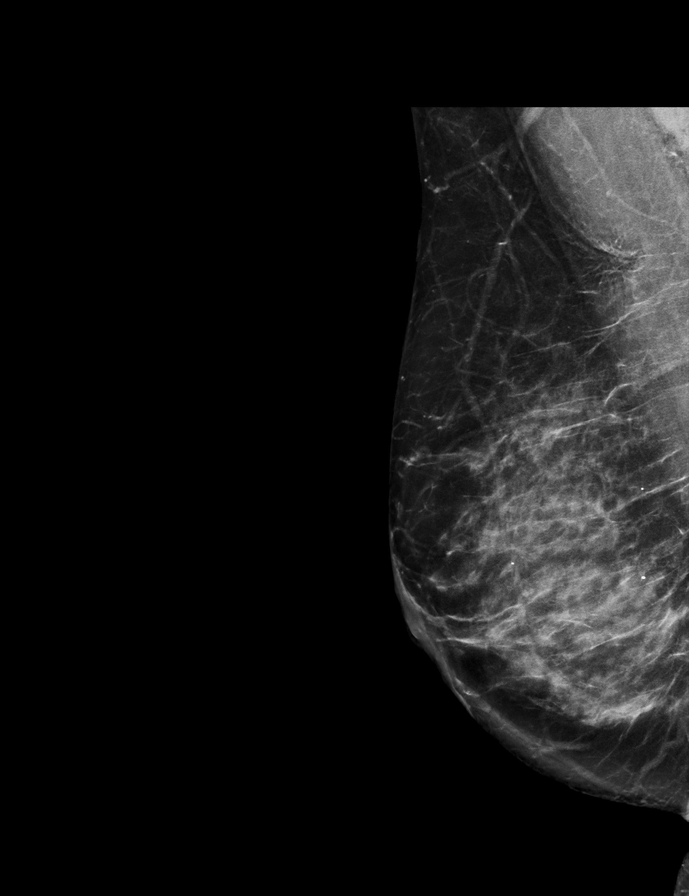

[L MLO synth-2D]
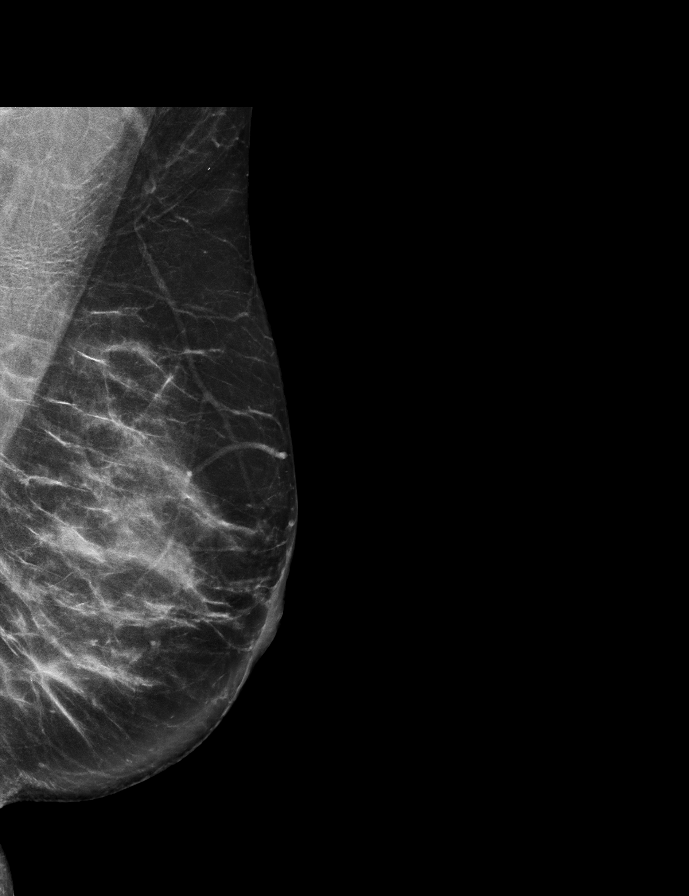

[L CC synth-2D]
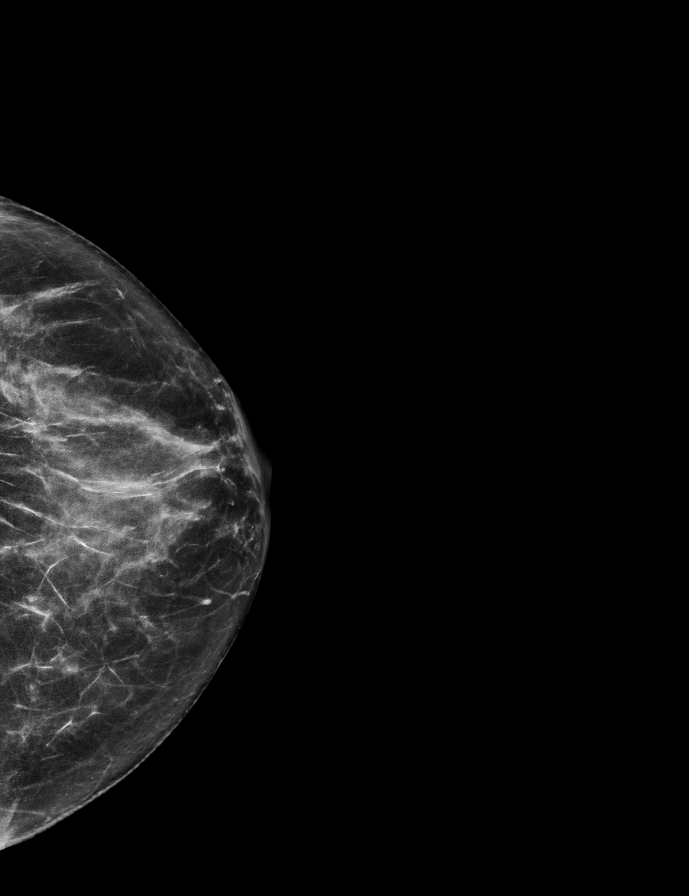

[R CC synth-2D]
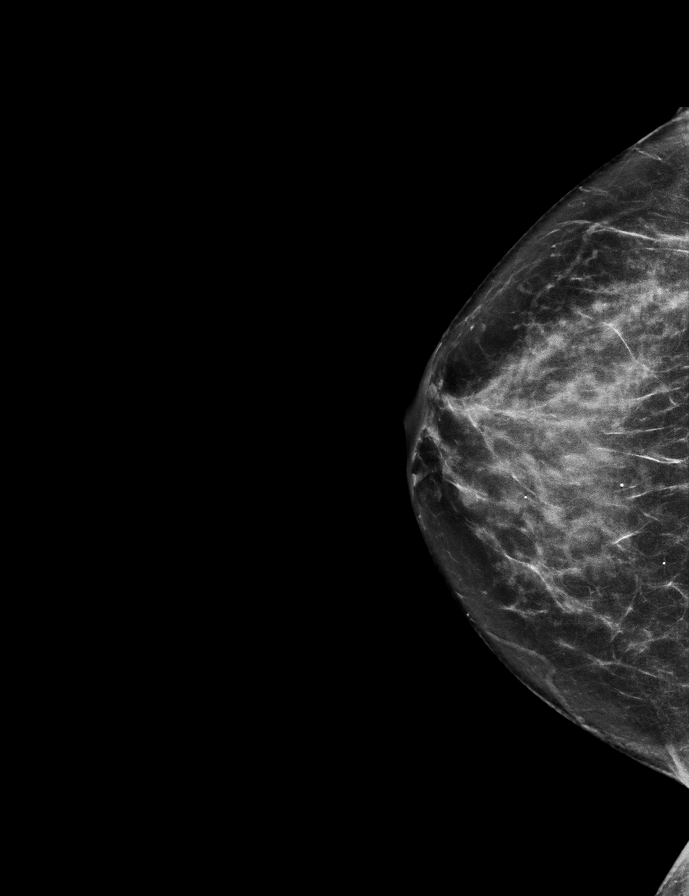

[R CC tomo · 2 of 60 frames shown]
[frame 20/60]
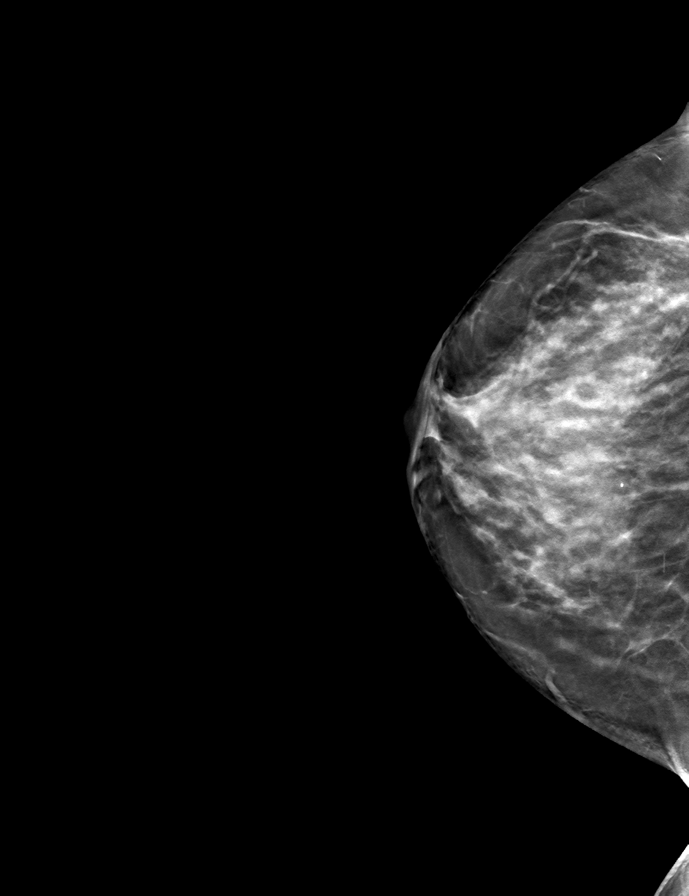
[frame 31/60]
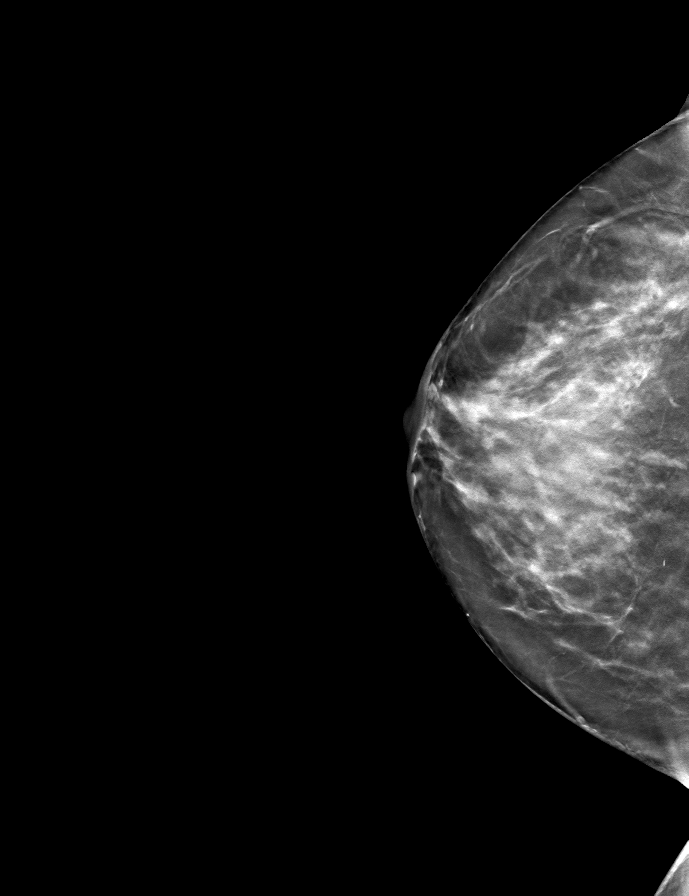

[L CC tomo · tomo slice 29/57.0]
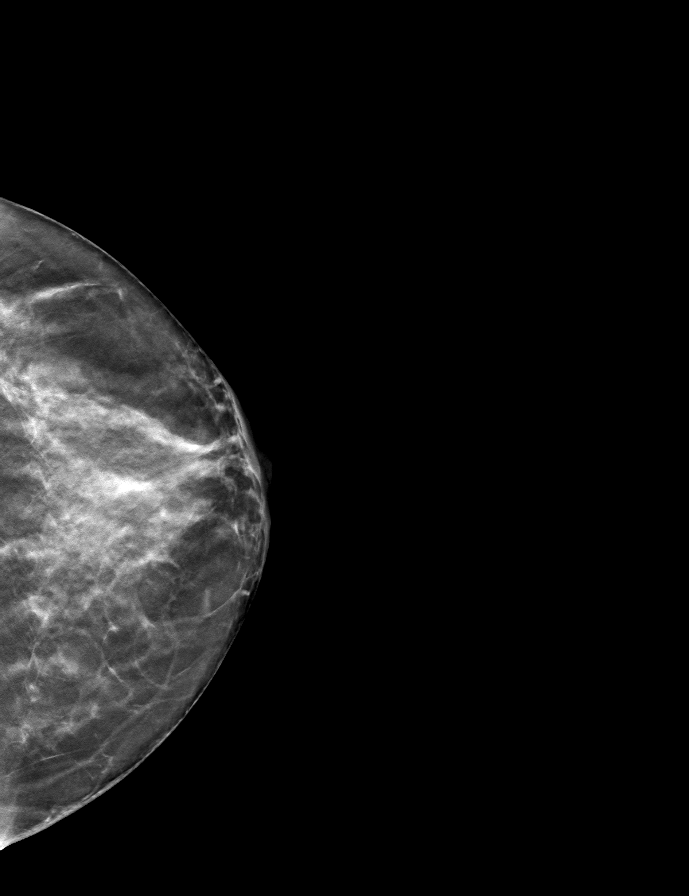

[R MLO tomo · tomo slice 33/64.0]
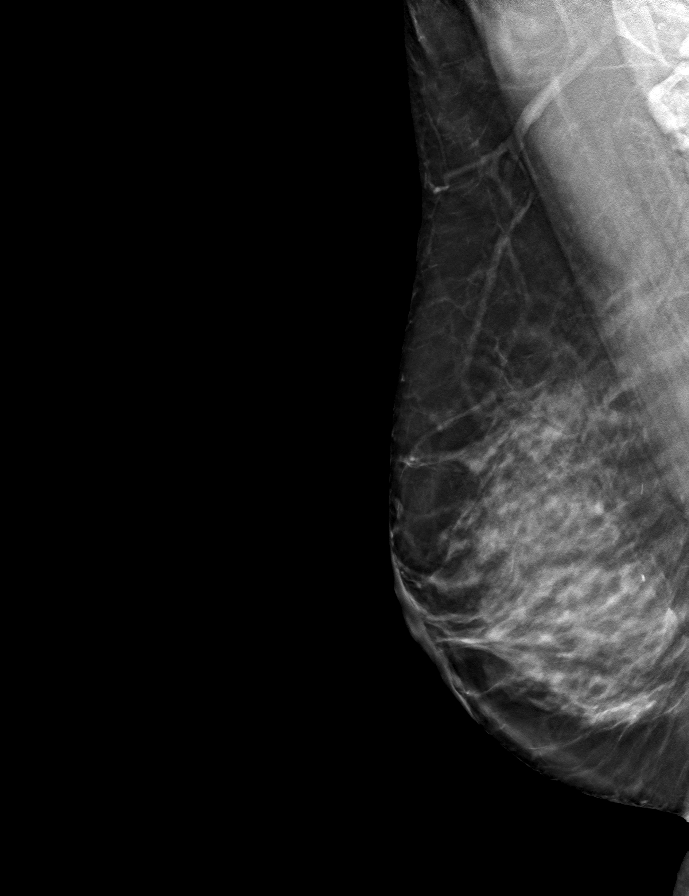

[L MLO tomo · tomo slice 35/68.0]
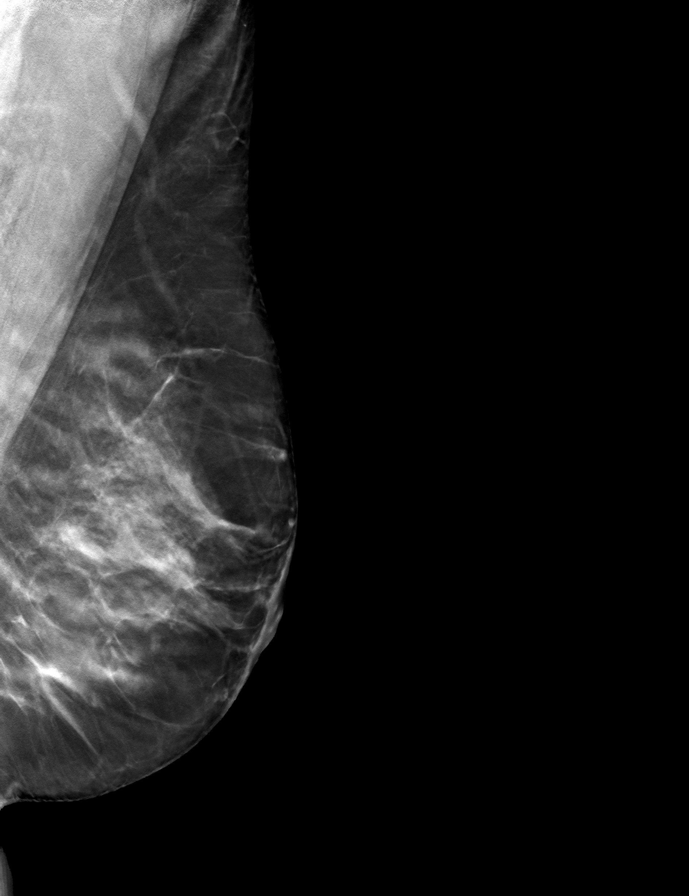

[9 of 24 positions shown; findings below may reference images not displayed]

ACR Breast Density Category c: The breast tissue is heterogeneously
dense, which may obscure small masses.
FINDINGS: There are no findings suspicious for malignancy.
IMPRESSION: No mammographic evidence of malignancy. A result letter of this
screening mammogram will be mailed directly to the patient.

RECOMMENDATION:
Screening mammogram in one year. (Code:Q3-W-BC3)

BI-RADS CATEGORY  1: Negative.

## 2023-10-22 ENCOUNTER — Ambulatory Visit: Admitting: Family Medicine

## 2023-10-22 ENCOUNTER — Encounter: Payer: Self-pay | Admitting: Family Medicine

## 2023-10-22 VITALS — BP 128/76 | HR 54 | Temp 98.8°F | Resp 18 | Ht 62.0 in | Wt 125.4 lb

## 2023-10-22 DIAGNOSIS — R109 Unspecified abdominal pain: Secondary | ICD-10-CM | POA: Diagnosis not present

## 2023-10-22 LAB — POCT URINALYSIS DIPSTICK
Bilirubin, UA: NEGATIVE
Blood, UA: NEGATIVE
Glucose, UA: NEGATIVE
Ketones, UA: NEGATIVE
Leukocytes, UA: NEGATIVE
Nitrite, UA: NEGATIVE
Protein, UA: NEGATIVE
Spec Grav, UA: <=1.005 — AB (ref 1.010–1.025)
Urobilinogen, UA: 0.2 U/dL
pH, UA: 7.5 (ref 5.0–8.0)

## 2023-10-22 MED ORDER — TRAMADOL HCL 50 MG PO TABS: 50.0000 mg | ORAL_TABLET | Freq: Three times a day (TID) | ORAL | 0 refills | Status: AC | PRN

## 2023-10-22 MED ORDER — ONDANSETRON 4 MG PO TBDP: 4.0000 mg | ORAL_TABLET | Freq: Three times a day (TID) | ORAL | 0 refills | Status: DC | PRN

## 2023-10-22 MED ORDER — TAMSULOSIN HCL 0.4 MG PO CAPS: 0.4000 mg | ORAL_CAPSULE | Freq: Every day | ORAL | 3 refills | Status: DC

## 2023-10-22 NOTE — Progress Notes (Signed)
 Chief Complaint  Patient presents with   Flank Pain    Left Onset: 2 days    Subjective: Patient is a 58 y.o. female here for possible kidney stone.  She has a personal and famhx of kidney stones. 1 d ago, she started having s/s's yesterday. No inj or change in activity. No blood in her urine. This feels very similar to her other 2 kidney stones. No N/V, urinary complaints, fevers, bowel changes. Has not tried anything at home yet.    Past Medical History:  Diagnosis Date   AC (acromioclavicular) joint bone spurs    Allergy    SEASONAL   Chicken pox    Kidney stones    Uterine fibroid     Objective: BP 128/76 (BP Location: Left Arm, Cuff Size: Normal)   Pulse (!) 54   Temp 98.8 F (37.1 C)   Resp 18   Ht 5' 2 (1.575 m)   Wt 125 lb 6.4 oz (56.9 kg)   SpO2 98%   BMI 22.94 kg/m  General: Awake, appears stated age Heart: RRR, no LE edema MSK: Neg Lloyd's, mild ttp over L flank Abd: BS+, S, ND, NT Lungs: CTAB, no rales, wheezes or rhonchi. No accessory muscle use Psych: Age appropriate judgment and insight, normal affect and mood  Assessment and Plan: Flank pain - Plan: POCT Urinalysis Dipstick  Will tx for stone.  Tylenol, Flomax  0.4 mg/d, tramadol  as needed, Zofran  as needed.  She reports this is how her previous stones started off.  She has a test she is taking on Monday and would like to be optimized for that over the weekend.  She has tolerated tramadol  well in the past.  If symptoms significantly worsen and she is unable to keep anything down, she is to go to the emergency department.  No signs of infection on her urinalysis. The patient voiced understanding and agreement to the plan.  Mabel [place], DO 10/22/23  11:30 AM

## 2023-10-22 NOTE — Patient Instructions (Signed)
 Do not drink alcohol, do any illicit/street drugs, drive or do anything that requires alertness while on this medicine.   OK to take Tylenol 1000 mg (2 extra strength tabs) or 975 mg (3 regular strength tabs) every 6 hours as needed.  If things get worse or we are unable to keep down fluids/medicine, please seek immediate care.  Let us  know if you need anything.

## 2023-11-08 ENCOUNTER — Other Ambulatory Visit: Payer: Self-pay | Admitting: Family Medicine

## 2023-11-08 DIAGNOSIS — R109 Unspecified abdominal pain: Secondary | ICD-10-CM

## 2023-12-04 ENCOUNTER — Other Ambulatory Visit: Payer: Self-pay | Admitting: Family Medicine

## 2023-12-04 DIAGNOSIS — E785 Hyperlipidemia, unspecified: Secondary | ICD-10-CM

## 2023-12-16 ENCOUNTER — Encounter: Payer: Self-pay | Admitting: Family Medicine

## 2023-12-16 ENCOUNTER — Ambulatory Visit: Payer: BC Managed Care – PPO | Admitting: Family Medicine

## 2023-12-16 VITALS — BP 130/72 | HR 70 | Temp 98.7°F | Resp 16 | Ht 62.0 in | Wt 126.6 lb

## 2023-12-16 DIAGNOSIS — Z23 Encounter for immunization: Secondary | ICD-10-CM | POA: Diagnosis not present

## 2023-12-16 DIAGNOSIS — Z0000 Encounter for general adult medical examination without abnormal findings: Secondary | ICD-10-CM | POA: Diagnosis not present

## 2023-12-16 DIAGNOSIS — E785 Hyperlipidemia, unspecified: Secondary | ICD-10-CM

## 2023-12-16 NOTE — Progress Notes (Signed)
 Subjective:    Patient ID: Emily Kirby, female    DOB: 1965/05/29, 58 y.o.   MRN: [id number]  Chief Complaint  Patient presents with   Annual Exam    Pt states fasting     HPI Patient is in today for cpe.  Discussed the use of AI scribe software for clinical note transcription with the patient, who gave verbal consent to proceed.  History of Present Illness Emily Kirby is a 58 year old female who presents for an annual physical exam and medication management.  She has been using a medication from [place], believed to contain diclofenac , every other day for pain management, particularly in her left shoulder, which feels 'freezing cold' at night. Without the medication, she experiences significant pain, describing her fingers as 'stiff' and the pain as 'unbearable'. She is unsure about the dosage, which she thinks might be 850 mg, but is uncertain if this is per tablet or total.  She has recently developed a bunion, noticed about a month ago, possibly related to tight tennis shoes. She uses a device at night to help correct the bunion and has adjusted her footwear to alleviate discomfort.  She recalls a past episode where she suspected a kidney stone due to symptoms but did not pass a stone. She was prescribed medication for nausea and pain, but over-the-counter medication resolved her symptoms.  Her daughter, aged 53, has been experiencing unexplained chronic pain in her hands for over two years. Despite extensive testing, no definitive diagnosis has been made. Her daughter is trying a new therapy called 'scramble therapy' in [place], which aims to alter pain signal processing in the brain.    Past Medical History:  Diagnosis Date   AC (acromioclavicular) joint bone spurs    Allergy    SEASONAL   Chicken pox    Kidney stones    Uterine fibroid     Past Surgical History:  Procedure Laterality Date   BREAST CYST ASPIRATION Right 12/06/2015   benign   CESAREAN SECTION      DILATION AND CURETTAGE OF UTERUS     LIPOSUCTION      Family History  Problem Relation Age of Onset   Hypertension Mother    Hyperlipidemia Mother    Hearing loss Father    COPD Father    Heart disease Father        MI   Prostate cancer Father    ADD / ADHD Brother    Colon cancer Neg Hx    Breast cancer Neg Hx    BRCA 1/2 Neg Hx     Social History   Socioeconomic History   Marital status: Married    Spouse name: Not on file   Number of children: 1   Years of education: Not on file   Highest education level: Associate degree: academic program  Occupational History    Employer: [employer]    Comment: court interpreter  Tobacco Use   Smoking status: Never   Smokeless tobacco: Never  Vaping Use   Vaping status: Never Used  Substance and Sexual Activity   Alcohol use: Yes    Alcohol/week: 7.0 standard drinks of alcohol    Types: 7 Shots of liquor per week    Comment: 4 drinks a week   Drug use: No   Sexual activity: Yes    Partners: Male  Other Topics Concern   Not on file  Social History Narrative   Plays tennis and  goes to gym for exercise classes    Social Drivers of Health   Financial Resource Strain: Low Risk  (08/19/2023)   Overall Financial Resource Strain (CARDIA)    Difficulty of Paying Living Expenses: Not hard at all  Food Insecurity: No Food Insecurity (08/19/2023)   Hunger Vital Sign    Worried About Running Out of Food in the Last Year: Never true    Ran Out of Food in the Last Year: Never true  Transportation Needs: No Transportation Needs (08/19/2023)   PRAPARE - Administrator, Civil Service (Medical): No    Lack of Transportation (Non-Medical): No  Physical Activity: Sufficiently Active (08/19/2023)   Exercise Vital Sign    Days of Exercise per Week: 3 days    Minutes of Exercise per Session: 70 min  Stress: No Stress Concern Present (08/19/2023)   [employer] of Occupational Health - Occupational  Stress Questionnaire    Feeling of Stress: Only a little  Social Connections: Moderately Integrated (08/19/2023)   Social Connection and Isolation Panel    Frequency of Communication with Friends and Family: More than three times a week    Frequency of Social Gatherings with Friends and Family: Once a week    Attends Religious Services: Never    Database administrator or Organizations: Yes    Attends Engineer, structural: More than 4 times per year    Marital Status: Married  Catering manager Violence: Not on file    Outpatient Medications Prior to Visit  Medication Sig Dispense Refill   buPROPion  (WELLBUTRIN  XL) 300 MG 24 hr tablet Take 1 tablet (300 mg total) by mouth daily. 90 tablet 3   simvastatin  (ZOCOR ) 20 MG tablet Take 1 tablet (20 mg total) by mouth at bedtime. 90 tablet 1   ondansetron  (ZOFRAN -ODT) 4 MG disintegrating tablet TAKE 1 TABLET BY MOUTH EVERY 8 HOURS AS NEEDED FOR NAUSEA AND VOMITING **MAX PER INS** 18 tablet 1   tamsulosin  (FLOMAX ) 0.4 MG CAPS capsule Take 1 capsule (0.4 mg total) by mouth daily. 30 capsule 3   No facility-administered medications prior to visit.    Allergies  Allergen Reactions   Codeine Other (See Comments)    Stomach upset   Pseudoephedrine Other (See Comments)    Tingling all over body    Review of Systems  Constitutional:  Negative for fever and malaise/fatigue.  HENT:  Negative for congestion.   Eyes:  Negative for blurred vision.  Respiratory:  Negative for shortness of breath.   Cardiovascular:  Negative for chest pain, palpitations and leg swelling.  Gastrointestinal:  Negative for abdominal pain, blood in stool and nausea.  Genitourinary:  Negative for dysuria and frequency.  Musculoskeletal:  Negative for falls.  Skin:  Negative for rash.  Neurological:  Negative for dizziness, loss of consciousness and headaches.  Endo/Heme/Allergies:  Negative for environmental allergies.  Psychiatric/Behavioral:  Negative for  depression. The patient is not nervous/anxious.        Objective:    Physical Exam Vitals and nursing note reviewed.  Constitutional:      General: She is not in acute distress.    Appearance: Normal appearance. She is well-developed.  HENT:     Head: Normocephalic and atraumatic.  Eyes:     General: No scleral icterus.       Right eye: No discharge.        Left eye: No discharge.  Cardiovascular:     Rate and Rhythm: Normal rate  and regular rhythm.     Heart sounds: No murmur heard. Pulmonary:     Effort: Pulmonary effort is normal. No respiratory distress.     Breath sounds: Normal breath sounds.  Musculoskeletal:        General: Normal range of motion.     Cervical back: Normal range of motion and neck supple.     Right lower leg: No edema.     Left lower leg: No edema.  Skin:    General: Skin is warm and dry.  Neurological:     Mental Status: She is alert and oriented to person, place, and time.  Psychiatric:        Mood and Affect: Mood normal.        Behavior: Behavior normal.        Thought Content: Thought content normal.        Judgment: Judgment normal.     BP 130/72 (BP Location: Right Arm, Patient Position: Sitting, Cuff Size: Normal)   Pulse 70   Temp 98.7 F (37.1 C) (Oral)   Resp 16   Ht 5' 2 (1.575 m)   Wt 126 lb 9.6 oz (57.4 kg)   LMP 03/06/2020   SpO2 96%   BMI 23.16 kg/m  Wt Readings from Last 3 Encounters:  12/16/23 126 lb 9.6 oz (57.4 kg)  10/22/23 125 lb 6.4 oz (56.9 kg)  08/19/23 126 lb (57.2 kg)    Diabetic Foot Exam - Simple   No data filed    Lab Results  Component Value Date   WBC 4.9 01/07/2023   HGB 13.4 01/07/2023   HCT 42.3 01/07/2023   PLT 274.0 01/07/2023   GLUCOSE 86 01/07/2023   CHOL 183 01/07/2023   TRIG 68.0 01/07/2023   HDL 70.10 01/07/2023   LDLCALC 99 01/07/2023   ALT 43 (H) 01/07/2023   AST 30 01/07/2023   NA 143 01/07/2023   K 4.4 01/07/2023   CL 104 01/07/2023   CREATININE 0.84 01/07/2023   BUN  15 01/07/2023   CO2 32 01/07/2023   TSH 3.06 01/07/2023    Lab Results  Component Value Date   TSH 3.06 01/07/2023   Lab Results  Component Value Date   WBC 4.9 01/07/2023   HGB 13.4 01/07/2023   HCT 42.3 01/07/2023   MCV 86.0 01/07/2023   PLT 274.0 01/07/2023   Lab Results  Component Value Date   NA 143 01/07/2023   K 4.4 01/07/2023   CO2 32 01/07/2023   GLUCOSE 86 01/07/2023   BUN 15 01/07/2023   CREATININE 0.84 01/07/2023   BILITOT 0.3 01/07/2023   ALKPHOS 79 01/07/2023   AST 30 01/07/2023   ALT 43 (H) 01/07/2023   PROT 7.0 01/07/2023   ALBUMIN 4.5 01/07/2023   CALCIUM 9.7 01/07/2023   GFR 77.19 01/07/2023   Lab Results  Component Value Date   CHOL 183 01/07/2023   Lab Results  Component Value Date   HDL 70.10 01/07/2023   Lab Results  Component Value Date   LDLCALC 99 01/07/2023   Lab Results  Component Value Date   TRIG 68.0 01/07/2023   Lab Results  Component Value Date   CHOLHDL 3 01/07/2023   No results found for: HGBA1C     Assessment & Plan:  Preventative health care Assessment & Plan: Ghm utd Check labs  See AVS Health Maintenance  Topic Date Due   HIV Screening  Never done   Hepatitis B Vaccines 19-59 Average Risk (1 of  3 - 19+ 3-dose series) Never done   Pneumococcal Vaccine: 50+ Years (1 of 1 - PCV) Never done   COVID-19 Vaccine (4 - 2025-26 season) 11/08/2023   Mammogram  03/29/2024   Cervical Cancer Screening (HPV/Pap Cotest)  02/02/2025   Colonoscopy  06/30/2026   DTaP/Tdap/Td (3 - Td or Tdap) 11/21/2032   Influenza Vaccine  Completed   Hepatitis C Screening  Completed   Zoster Vaccines- Shingrix   Completed   HPV VACCINES  Aged Out   Meningococcal B Vaccine  Aged Out     Orders: -     CBC with Differential/Platelet -     Comprehensive metabolic panel with GFR -     Lipid panel -     TSH  Hyperlipidemia, unspecified hyperlipidemia type -     Comprehensive metabolic panel with GFR -     Lipid panel  Need for  influenza vaccination -     Flu vaccine trivalent PF, 6mos and older(Flulaval,Afluria,Fluarix,Fluzone)   Assessment and Plan Assessment & Plan Adult Wellness Visit   Routine adult wellness visit with no new surgeries or changes in family history. An upcoming mammogram is scheduled for January. Administer a flu shot and perform lab tests to assess kidney function.  Chronic joint pain   Chronic joint pain in the left shoulder and fingers is managed with diclofenac  from [place] taken every other day. Significant relief is noted, but there are concerns about hidden ingredients and risks of NSAIDs, including increased cardiac events and potential for stomach ulcers. Advise taking diclofenac  every three days instead of every other day. Consider prescribing diclofenac  75 mg if the current regimen becomes ineffective. Allow the use of acetaminophen as needed for additional pain relief.  Bunion of foot   A recent onset of a bunion, likely exacerbated by tight tennis shoes, is causing mild discomfort. It is managed with a toe alignment device and wearing wider shoes. No current need for surgical intervention. Continue using the toe alignment device and wear wider shoes to alleviate pressure on the bunion.   Ikeem Cleckler R Lowne Chase, DO

## 2023-12-16 NOTE — Assessment & Plan Note (Signed)
 Ghm utd Check labs  See AVS Health Maintenance  Topic Date Due   HIV Screening  Never done   Hepatitis B Vaccines 19-59 Average Risk (1 of 3 - 19+ 3-dose series) Never done   Pneumococcal Vaccine: 50+ Years (1 of 1 - PCV) Never done   COVID-19 Vaccine (4 - 2025-26 season) 11/08/2023   Mammogram  03/29/2024   Cervical Cancer Screening (HPV/Pap Cotest)  02/02/2025   Colonoscopy  06/30/2026   DTaP/Tdap/Td (3 - Td or Tdap) 11/21/2032   Influenza Vaccine  Completed   Hepatitis C Screening  Completed   Zoster Vaccines- Shingrix   Completed   HPV VACCINES  Aged Out   Meningococcal B Vaccine  Aged Out

## 2023-12-17 LAB — CBC WITH DIFFERENTIAL/PLATELET
Basophils Absolute: 0 K/uL (ref 0.0–0.1)
Basophils Relative: 0.7 % (ref 0.0–3.0)
Eosinophils Absolute: 0.1 K/uL (ref 0.0–0.7)
Eosinophils Relative: 2.2 % (ref 0.0–5.0)
HCT: 42.3 % (ref 36.0–46.0)
Hemoglobin: 13.8 g/dL (ref 12.0–15.0)
Lymphocytes Relative: 36.6 % (ref 12.0–46.0)
Lymphs Abs: 1.6 K/uL (ref 0.7–4.0)
MCHC: 32.6 g/dL (ref 30.0–36.0)
MCV: 84 fl (ref 78.0–100.0)
Monocytes Absolute: 0.3 K/uL (ref 0.1–1.0)
Monocytes Relative: 6.4 % (ref 3.0–12.0)
Neutro Abs: 2.4 K/uL (ref 1.4–7.7)
Neutrophils Relative %: 54.1 % (ref 43.0–77.0)
Platelets: 275 K/uL (ref 150.0–400.0)
RBC: 5.04 Mil/uL (ref 3.87–5.11)
RDW: 13.3 % (ref 11.5–15.5)
WBC: 4.4 K/uL (ref 4.0–10.5)

## 2023-12-17 LAB — LIPID PANEL
Cholesterol: 188 mg/dL (ref 0–200)
HDL: 63.4 mg/dL (ref >39.00)
LDL Cholesterol: 105 mg/dL — ABNORMAL HIGH (ref 0–99)
NonHDL: 124.94
Total CHOL/HDL Ratio: 3
Triglycerides: 102 mg/dL (ref 0.0–149.0)
VLDL: 20.4 mg/dL (ref 0.0–40.0)

## 2023-12-17 LAB — COMPREHENSIVE METABOLIC PANEL WITH GFR
ALT: 34 U/L (ref 0–35)
AST: 25 U/L (ref 0–37)
Albumin: 5.1 g/dL (ref 3.5–5.2)
Alkaline Phosphatase: 98 U/L (ref 39–117)
BUN: 14 mg/dL (ref 6–23)
CO2: 31 meq/L (ref 19–32)
Calcium: 9.9 mg/dL (ref 8.4–10.5)
Chloride: 103 meq/L (ref 96–112)
Creatinine, Ser: 0.79 mg/dL (ref 0.40–1.20)
GFR: 82.54 mL/min (ref >60.00)
Glucose, Bld: 99 mg/dL (ref 70–99)
Potassium: 4.4 meq/L (ref 3.5–5.1)
Sodium: 141 meq/L (ref 135–145)
Total Bilirubin: 0.4 mg/dL (ref 0.2–1.2)
Total Protein: 7.5 g/dL (ref 6.0–8.3)

## 2023-12-17 LAB — TSH: TSH: 1.82 u[IU]/mL (ref 0.35–5.50)

## 2023-12-26 ENCOUNTER — Ambulatory Visit: Payer: Self-pay | Admitting: Family Medicine

## 2024-01-08 ENCOUNTER — Other Ambulatory Visit: Payer: Self-pay | Admitting: Family Medicine

## 2024-01-08 DIAGNOSIS — F418 Other specified anxiety disorders: Secondary | ICD-10-CM

## 2024-01-10 MED ORDER — PRAVASTATIN SODIUM 40 MG PO TABS: 40.0000 mg | ORAL_TABLET | Freq: Every day | ORAL | 1 refills | Status: DC

## 2024-01-15 ENCOUNTER — Other Ambulatory Visit: Payer: Self-pay | Admitting: Family Medicine

## 2024-01-15 DIAGNOSIS — R10A0 Flank pain, unspecified side: Secondary | ICD-10-CM

## 2024-02-02 ENCOUNTER — Other Ambulatory Visit: Payer: Self-pay | Admitting: Family Medicine

## 2024-02-21 ENCOUNTER — Other Ambulatory Visit: Payer: Self-pay | Admitting: Family Medicine

## 2024-02-21 DIAGNOSIS — Z1231 Encounter for screening mammogram for malignant neoplasm of breast: Secondary | ICD-10-CM

## 2024-03-20 ENCOUNTER — Ambulatory Visit: Admitting: Family Medicine

## 2024-03-20 ENCOUNTER — Encounter: Payer: Self-pay | Admitting: Family Medicine

## 2024-03-20 VITALS — BP 120/70 | HR 85 | Temp 98.2°F | Resp 18 | Ht 62.0 in | Wt 124.4 lb

## 2024-03-20 DIAGNOSIS — E785 Hyperlipidemia, unspecified: Secondary | ICD-10-CM | POA: Diagnosis not present

## 2024-03-20 DIAGNOSIS — F418 Other specified anxiety disorders: Secondary | ICD-10-CM

## 2024-03-20 DIAGNOSIS — F325 Major depressive disorder, single episode, in full remission: Secondary | ICD-10-CM

## 2024-03-20 LAB — CBC WITH DIFFERENTIAL/PLATELET
Basophils Absolute: 0 K/uL (ref 0.0–0.1)
Basophils Relative: 1.1 % (ref 0.0–3.0)
Eosinophils Absolute: 0.1 K/uL (ref 0.0–0.7)
Eosinophils Relative: 2.4 % (ref 0.0–5.0)
HCT: 41.9 % (ref 36.0–46.0)
Hemoglobin: 13.7 g/dL (ref 12.0–15.0)
Lymphocytes Relative: 39.7 % (ref 12.0–46.0)
Lymphs Abs: 1.2 K/uL (ref 0.7–4.0)
MCHC: 32.6 g/dL (ref 30.0–36.0)
MCV: 82.4 fl (ref 78.0–100.0)
Monocytes Absolute: 0.2 K/uL (ref 0.1–1.0)
Monocytes Relative: 7.1 % (ref 3.0–12.0)
Neutro Abs: 1.5 K/uL (ref 1.4–7.7)
Neutrophils Relative %: 49.7 % (ref 43.0–77.0)
Platelets: 256 K/uL (ref 150.0–400.0)
RBC: 5.08 Mil/uL (ref 3.87–5.11)
RDW: 13.6 % (ref 11.5–15.5)
WBC: 3.1 K/uL — ABNORMAL LOW (ref 4.0–10.5)

## 2024-03-20 LAB — LIPID PANEL
Cholesterol: 170 mg/dL (ref 28–200)
HDL: 56.1 mg/dL
LDL Cholesterol: 97 mg/dL (ref 10–99)
NonHDL: 114.17
Total CHOL/HDL Ratio: 3
Triglycerides: 85 mg/dL (ref 10.0–149.0)
VLDL: 17 mg/dL (ref 0.0–40.0)

## 2024-03-20 LAB — COMPREHENSIVE METABOLIC PANEL WITH GFR
ALT: 41 U/L — ABNORMAL HIGH (ref 3–35)
AST: 30 U/L (ref 5–37)
Albumin: 4.7 g/dL (ref 3.5–5.2)
Alkaline Phosphatase: 92 U/L (ref 39–117)
BUN: 11 mg/dL (ref 6–23)
CO2: 31 meq/L (ref 19–32)
Calcium: 9.8 mg/dL (ref 8.4–10.5)
Chloride: 104 meq/L (ref 96–112)
Creatinine, Ser: 0.72 mg/dL (ref 0.40–1.20)
GFR: 92.1 mL/min
Glucose, Bld: 96 mg/dL (ref 70–99)
Potassium: 4.1 meq/L (ref 3.5–5.1)
Sodium: 142 meq/L (ref 135–145)
Total Bilirubin: 0.4 mg/dL (ref 0.2–1.2)
Total Protein: 7.1 g/dL (ref 6.0–8.3)

## 2024-03-20 LAB — TSH: TSH: 2.36 u[IU]/mL (ref 0.35–5.50)

## 2024-03-20 NOTE — Assessment & Plan Note (Signed)
 Tolerating statin, encouraged heart healthy diet, avoid trans fats, minimize simple carbs and saturated fats. Increase exercise as tolerated  Con't with pravastatin  and coq10 Doing better with myalgias but still present Check labs

## 2024-03-20 NOTE — Patient Instructions (Signed)
 Cholesterol Content in Foods Cholesterol is a waxy, fat-like substance that helps to carry fat in the blood. The body needs cholesterol in small amounts, but too much cholesterol can cause damage to the arteries and heart. What foods have cholesterol?  Cholesterol is found in animal-based foods, such as meat, seafood, and dairy. Generally, low-fat dairy and lean meats have less cholesterol than full-fat dairy and fatty meats. The milligrams of cholesterol per serving (mg per serving) of common cholesterol-containing foods are listed below. Meats and other proteins Egg -- one large whole egg has 186 mg. Veal shank -- 4 oz (113 g) has 141 mg. Lean ground [place] (93% lean) -- 4 oz (113 g) has 118 mg. Fat-trimmed lamb loin -- 4 oz (113 g) has 106 mg. Lean ground beef (90% lean) -- 4 oz (113 g) has 100 mg. Lobster -- 3.5 oz (99 g) has 90 mg. Pork loin chops -- 4 oz (113 g) has 86 mg. Canned salmon -- 3.5 oz (99 g) has 83 mg. Fat-trimmed beef top loin -- 4 oz (113 g) has 78 mg. Frankfurter -- 1 frank (3.5 oz or 99 g) has 77 mg. Crab -- 3.5 oz (99 g) has 71 mg. Roasted chicken without skin, white meat -- 4 oz (113 g) has 66 mg. Light bologna -- 2 oz (57 g) has 45 mg. Deli-cut [place] -- 2 oz (57 g) has 31 mg. Canned tuna -- 3.5 oz (99 g) has 31 mg. Tomasa Blase -- 1 oz (28 g) has 29 mg. Oysters and mussels (raw) -- 3.5 oz (99 g) has 25 mg. Mackerel -- 1 oz (28 g) has 22 mg. Trout -- 1 oz (28 g) has 20 mg. Pork sausage -- 1 link (1 oz or 28 g) has 17 mg. Salmon -- 1 oz (28 g) has 16 mg. Tilapia -- 1 oz (28 g) has 14 mg. Dairy Soft-serve ice cream --  cup (4 oz or 86 g) has 103 mg. Whole-milk yogurt -- 1 cup (8 oz or 245 g) has 29 mg. Cheddar cheese -- 1 oz (28 g) has 28 mg. American cheese -- 1 oz (28 g) has 28 mg. Whole milk -- 1 cup (8 oz or 250 mL) has 23 mg. 2% milk -- 1 cup (8 oz or 250 mL) has 18 mg. Cream cheese -- 1 tablespoon (Tbsp) (14.5 g) has 15 mg. Cottage cheese --  cup (4 oz or  113 g) has 14 mg. Low-fat (1%) milk -- 1 cup (8 oz or 250 mL) has 10 mg. Sour cream -- 1 Tbsp (12 g) has 8.5 mg. Low-fat yogurt -- 1 cup (8 oz or 245 g) has 8 mg. Nonfat Greek yogurt -- 1 cup (8 oz or 228 g) has 7 mg. Half-and-half cream -- 1 Tbsp (15 mL) has 5 mg. Fats and oils Cod liver oil -- 1 tablespoon (Tbsp) (13.6 g) has 82 mg. Butter -- 1 Tbsp (14 g) has 15 mg. Lard -- 1 Tbsp (12.8 g) has 14 mg. Bacon grease -- 1 Tbsp (12.9 g) has 14 mg. Mayonnaise -- 1 Tbsp (13.8 g) has 5-10 mg. Margarine -- 1 Tbsp (14 g) has 3-10 mg. The items listed above may not be a complete list of foods with cholesterol. Exact amounts of cholesterol in these foods may vary depending on specific ingredients and brands. Contact a dietitian for more information. What foods do not have cholesterol? Most plant-based foods do not have cholesterol unless you combine them with a food that has  cholesterol. Foods without cholesterol include: Grains and cereals. Vegetables. Fruits. Vegetable oils, such as olive, canola, and sunflower oil. Legumes, such as peas, beans, and lentils. Nuts and seeds. Egg whites. The items listed above may not be a complete list of foods that do not have cholesterol. Contact a dietitian for more information. Summary The body needs cholesterol in small amounts, but too much cholesterol can cause damage to the arteries and heart. Cholesterol is found in animal-based foods, such as meat, seafood, and dairy. Generally, low-fat dairy and lean meats have less cholesterol than full-fat dairy and fatty meats. This information is not intended to replace advice given to you by your health care provider. Make sure you discuss any questions you have with your health care provider. Document Revised: 07/05/2020 Document Reviewed: 07/05/2020 Elsevier Patient Education  2024 [employer].

## 2024-03-20 NOTE — Assessment & Plan Note (Signed)
 Con't wellbutrin  Stable

## 2024-03-20 NOTE — Progress Notes (Signed)
 "  Subjective:    Patient ID: Emily Kirby, female    DOB: 01-27-1966, 59 y.o.   MRN: [id number]  Chief Complaint  Patient presents with   Hyperlipidemia   Depression   Follow-up    HPI Patient is in today for f/u.  Discussed the use of AI scribe software for clinical note transcription with the patient, who gave verbal consent to proceed.  History of Present Illness Emily Kirby is a 59 year old female who presents for follow-up of her cholesterol management.  She experienced a recent episode of severe gastrointestinal illness that began the day before a trip to [place], suspected to be food poisoning from a chicken salad consumed the previous night. Upon arrival in [place], she visited the emergency room and was treated with antibiotics and stomach enzymes. The illness significantly impacted her ability to eat normally for some time.  She is currently taking pravastatin  for hyperlipidemia, having switched from simvastatin  due to numbness in the back of her knee. She reports some improvement with pravastatin  and the addition of CoQ10, which she believes helps alleviate symptoms. She experienced no side effects at 20 mg of simvastatin , but developed leg pain when the dose was increased to 40 mg. Her cholesterol levels were well-controlled on the lower dose until they spiked last year, prompting the increase.  She has reduced her alcohol intake significantly since her gastrointestinal illness, noting that she now experiences swelling in her hands after consuming wine. She has replaced her nightly alcoholic drink with tea and feels that this change, along with her medication regimen, has reduced the frequency of aches in her fingers.  She also mentions taking a medication from [place], which she uses every three days to manage symptoms, as it may contain diclofenac . This adjustment has been effective in managing her symptoms without adverse effects.    Past Medical History:  Diagnosis Date    AC (acromioclavicular) joint bone spurs    Allergy    SEASONAL   Chicken pox    Kidney stones    Uterine fibroid     Past Surgical History:  Procedure Laterality Date   BREAST CYST ASPIRATION Right 12/06/2015   benign   CESAREAN SECTION     DILATION AND CURETTAGE OF UTERUS     LIPOSUCTION      Family History  Problem Relation Age of Onset   Hypertension Mother    Hyperlipidemia Mother    Hearing loss Father    COPD Father    Heart disease Father        MI   Prostate cancer Father    ADD / ADHD Brother    Colon cancer Neg Hx    Breast cancer Neg Hx    BRCA 1/2 Neg Hx     Social History   Socioeconomic History   Marital status: Married    Spouse name: Not on file   Number of children: 1   Years of education: Not on file   Highest education level: Some college, no degree  Occupational History    Employer: [employer]    Comment: court interpreter  Tobacco Use   Smoking status: Never   Smokeless tobacco: Never  Vaping Use   Vaping status: Never Used  Substance and Sexual Activity   Alcohol use: Yes    Alcohol/week: 7.0 standard drinks of alcohol    Types: 7 Shots of liquor per week    Comment: 4 drinks a week  Drug use: No   Sexual activity: Yes    Partners: Male  Other Topics Concern   Not on file  Social History Narrative   Plays tennis and goes to gym for exercise classes    Social Drivers of Health   Tobacco Use: Low Risk (03/20/2024)   Patient History    Smoking Tobacco Use: Never    Smokeless Tobacco Use: Never    Passive Exposure: Not on file  Financial Resource Strain: Low Risk (03/16/2024)   Overall Financial Resource Strain (CARDIA)    Difficulty of Paying Living Expenses: Not hard at all  Food Insecurity: No Food Insecurity (03/16/2024)   Epic    Worried About Radiation Protection Practitioner of Food in the Last Year: Never true    Ran Out of Food in the Last Year: Never true  Transportation Needs: No Transportation Needs (03/16/2024)    Epic    Lack of Transportation (Medical): No    Lack of Transportation (Non-Medical): No  Physical Activity: Sufficiently Active (03/16/2024)   Exercise Vital Sign    Days of Exercise per Week: 3 days    Minutes of Exercise per Session: 60 min  Stress: No Stress Concern Present (03/16/2024)   [employer] of Occupational Health - Occupational Stress Questionnaire    Feeling of Stress: Only a little  Social Connections: Moderately Integrated (03/16/2024)   Social Connection and Isolation Panel    Frequency of Communication with Friends and Family: More than three times a week    Frequency of Social Gatherings with Friends and Family: Patient declined    Attends Religious Services: Patient declined    Database Administrator or Organizations: Yes    Attends Banker Meetings: 1 to 4 times per year    Marital Status: Married  Catering Manager Violence: Not on file  Depression (PHQ2-9): Low Risk (12/16/2023)   Depression (PHQ2-9)    PHQ-2 Score: 0  Alcohol Screen: Low Risk (03/16/2024)   Alcohol Screen    Last Alcohol Screening Score (AUDIT): 2  Housing: Low Risk (03/16/2024)   Epic    Unable to Pay for Housing in the Last Year: No    Number of Times Moved in the Last Year: 0    Homeless in the Last Year: No  Utilities: Not on file  Health Literacy: Not on file    Outpatient Medications Prior to Visit  Medication Sig Dispense Refill   buPROPion  (WELLBUTRIN  XL) 300 MG 24 hr tablet Take 1 tablet (300 mg total) by mouth daily. 90 tablet 3   pravastatin  (PRAVACHOL ) 40 MG tablet Take 1 tablet (40 mg total) by mouth at bedtime. 90 tablet 3   No facility-administered medications prior to visit.    Allergies  Allergen Reactions   Codeine Other (See Comments)    Stomach upset   Pseudoephedrine Other (See Comments)    Tingling all over body    Review of Systems  Constitutional:  Negative for fever and malaise/fatigue.  HENT:  Negative for congestion.   Eyes:  Negative for  blurred vision.  Respiratory:  Negative for shortness of breath.   Cardiovascular:  Negative for chest pain, palpitations and leg swelling.  Gastrointestinal:  Negative for abdominal pain, blood in stool and nausea.  Genitourinary:  Negative for dysuria and frequency.  Musculoskeletal:  Positive for myalgias. Negative for falls.  Skin:  Negative for rash.  Neurological:  Negative for dizziness, loss of consciousness and headaches.  Endo/Heme/Allergies:  Negative for environmental allergies.  Psychiatric/Behavioral:  Negative for depression. The patient is not nervous/anxious.        Objective:    Physical Exam Vitals and nursing note reviewed.  Constitutional:      General: She is not in acute distress.    Appearance: Normal appearance. She is well-developed.  HENT:     Head: Normocephalic and atraumatic.  Eyes:     General: No scleral icterus.       Right eye: No discharge.        Left eye: No discharge.  Cardiovascular:     Rate and Rhythm: Normal rate and regular rhythm.     Heart sounds: No murmur heard. Pulmonary:     Effort: Pulmonary effort is normal. No respiratory distress.     Breath sounds: Normal breath sounds.  Musculoskeletal:        General: Normal range of motion.     Cervical back: Normal range of motion and neck supple.     Right lower leg: No edema.     Left lower leg: No edema.  Skin:    General: Skin is warm and dry.  Neurological:     Mental Status: She is alert and oriented to person, place, and time.  Psychiatric:        Mood and Affect: Mood normal.        Behavior: Behavior normal.        Thought Content: Thought content normal.        Judgment: Judgment normal.     BP 120/70 (BP Location: Left Arm, Patient Position: Sitting, Cuff Size: Normal)   Pulse 85   Temp 98.2 F (36.8 C) (Oral)   Resp 18   Ht 5' 2 (1.575 m)   Wt 124 lb 6.4 oz (56.4 kg)   LMP 03/06/2020   SpO2 98%   BMI 22.75 kg/m  Wt Readings from Last 3 Encounters:   03/20/24 124 lb 6.4 oz (56.4 kg)  12/16/23 126 lb 9.6 oz (57.4 kg)  10/22/23 125 lb 6.4 oz (56.9 kg)    Diabetic Foot Exam - Simple   No data filed    Lab Results  Component Value Date   WBC 4.4 12/16/2023   HGB 13.8 12/16/2023   HCT 42.3 12/16/2023   PLT 275.0 12/16/2023   GLUCOSE 99 12/16/2023   CHOL 188 12/16/2023   TRIG 102.0 12/16/2023   HDL 63.40 12/16/2023   LDLCALC 105 (H) 12/16/2023   ALT 34 12/16/2023   AST 25 12/16/2023   NA 141 12/16/2023   K 4.4 12/16/2023   CL 103 12/16/2023   CREATININE 0.79 12/16/2023   BUN 14 12/16/2023   CO2 31 12/16/2023   TSH 1.82 12/16/2023    Lab Results  Component Value Date   TSH 1.82 12/16/2023   Lab Results  Component Value Date   WBC 4.4 12/16/2023   HGB 13.8 12/16/2023   HCT 42.3 12/16/2023   MCV 84.0 12/16/2023   PLT 275.0 12/16/2023   Lab Results  Component Value Date   NA 141 12/16/2023   K 4.4 12/16/2023   CO2 31 12/16/2023   GLUCOSE 99 12/16/2023   BUN 14 12/16/2023   CREATININE 0.79 12/16/2023   BILITOT 0.4 12/16/2023   ALKPHOS 98 12/16/2023   AST 25 12/16/2023   ALT 34 12/16/2023   PROT 7.5 12/16/2023   ALBUMIN 5.1 12/16/2023   CALCIUM 9.9 12/16/2023   GFR 82.54 12/16/2023   Lab Results  Component Value Date   CHOL 188 12/16/2023  Lab Results  Component Value Date   HDL 63.40 12/16/2023   Lab Results  Component Value Date   LDLCALC 105 (H) 12/16/2023   Lab Results  Component Value Date   TRIG 102.0 12/16/2023   Lab Results  Component Value Date   CHOLHDL 3 12/16/2023   No results found for: HGBA1C     Assessment & Plan:  Depression with anxiety -     CBC with Differential/Platelet -     Comprehensive metabolic panel with GFR -     Lipid panel -     TSH  Hyperlipidemia, unspecified hyperlipidemia type Assessment & Plan: Tolerating statin, encouraged heart healthy diet, avoid trans fats, minimize simple carbs and saturated fats. Increase exercise as tolerated  Con't with  pravastatin  and coq10 Doing better with myalgias but still present Check labs   Orders: -     CBC with Differential/Platelet -     Comprehensive metabolic panel with GFR -     Lipid panel  Major depressive disorder with single episode, in full remission Assessment & Plan: Con't wellbutrin  Stable    Assessment and Plan Assessment & Plan Hyperlipidemia   Pravastatin  is used to manage hyperlipidemia due to previous side effects from simvastatin  40 mg, such as leg pain and numbness. Simvastatin  20 mg was well-tolerated but did not adequately control cholesterol levels, necessitating an increase to 40 mg, which again caused side effects. Pravastatin  is better tolerated, though some symptoms persist. CoQ10 supplementation may help alleviate these symptoms. Lifestyle factors, including diet, exercise, and alcohol consumption, can influence cholesterol levels. She has reduced alcohol intake, which may contribute to improved symptoms. Current cholesterol levels have been checked to assess the need for medication adjustment. Continue pravastatin  and CoQ10 supplementation. Consider reducing the pravastatin  dose if cholesterol levels are well-controlled. Encourage continued lifestyle modifications, including reduced alcohol intake and mindful eating.    Annarae Macnair R Lowne Chase, DO  "

## 2024-03-26 ENCOUNTER — Ambulatory Visit: Payer: Self-pay | Admitting: Family Medicine

## 2024-03-29 ENCOUNTER — Other Ambulatory Visit (HOSPITAL_BASED_OUTPATIENT_CLINIC_OR_DEPARTMENT_OTHER): Payer: Self-pay

## 2024-03-29 ENCOUNTER — Encounter: Payer: Self-pay | Admitting: Student

## 2024-03-29 ENCOUNTER — Ambulatory Visit: Admitting: Student

## 2024-03-29 VITALS — BP 116/78 | HR 88 | Resp 16 | Ht 61.5 in | Wt 124.6 lb

## 2024-03-29 DIAGNOSIS — W5503XA Scratched by cat, initial encounter: Secondary | ICD-10-CM | POA: Insufficient documentation

## 2024-03-29 DIAGNOSIS — S60412A Abrasion of right middle finger, initial encounter: Secondary | ICD-10-CM | POA: Diagnosis not present

## 2024-03-29 MED ORDER — AMOXICILLIN-POT CLAVULANATE 875-125 MG PO TABS
1.0000 | ORAL_TABLET | Freq: Two times a day (BID) | ORAL | 0 refills | Status: AC
  Filled 2024-03-29: qty 14, 7d supply, fill #0

## 2024-03-29 NOTE — Progress Notes (Signed)
 "  Acute Office Visit  Subjective:     Patient ID: Emily Kirby, female    DOB: Feb 19, 1966, 59 y.o.   MRN: [id number]  Chief Complaint  Patient presents with   Hand Pain    Patient is here for a scratch on her right middle finger. Happened yesterday, has been swollen and painful. No drainage.    HPI Patient is in today for acute visit.  Emily Kirby is a 59 year old female who presents with a swollen right hand following a cat scratch.  She was scratched on the right hand by an indoor cat yesterday around lunchtime. She was pet sitting for family member. She was dog sitting The cats UTS on vaccinations. The scratch initially bled, and she washed the area but did not use peroxide at the time. By yesterday evening the hand became swollen,  and she was  Ibuprofen gave temporary relief, but pain and swelling returned around 4 AM, now mainly on one side of the hand. She is using ibuprofen 200 mg every 4 to 6 hours as needed.   Patient denies fever, chills, SOB, CP, palpitations, dyspnea, edema, HA, vision changes, N/V/D, abdominal pain, urinary symptoms, rash, weight changes, and recent illness or hospitalizations.    ROS  See hPI    Objective:    BP 116/78 (BP Location: Right Arm, Patient Position: Sitting, Cuff Size: Normal)   Pulse 88   Resp 16   Ht 5' 1.5 (1.562 m)   Wt 124 lb 9.6 oz (56.5 kg)   LMP 03/06/2020   SpO2 98%   BMI 23.16 kg/m    Physical Exam Vitals reviewed.  Constitutional:      General: She is not in acute distress.    Appearance: She is not ill-appearing, toxic-appearing or diaphoretic.  HENT:     Head: Normocephalic and atraumatic.     Right Ear: Tympanic membrane, ear canal and external ear normal.     Left Ear: Tympanic membrane, ear canal and external ear normal.     Nose: Nose normal. No congestion.     Mouth/Throat:     Mouth: Mucous membranes are moist.     Pharynx: Oropharynx is clear.  Eyes:     Extraocular Movements: Extraocular movements  intact.     Right eye: Normal extraocular motion.     Left eye: Normal extraocular motion.     Conjunctiva/sclera: Conjunctivae normal.     Pupils: Pupils are equal, round, and reactive to light.  Neck:     Thyroid : No thyroid  mass or thyromegaly.     Vascular: No carotid bruit or JVD.  Cardiovascular:     Rate and Rhythm: Normal rate and regular rhythm.     Pulses: Normal pulses.          Radial pulses are 2+ on the right side and 2+ on the left side.     Heart sounds: Normal heart sounds, S1 normal and S2 normal. No murmur heard.    No friction rub. No gallop.  Pulmonary:     Effort: Pulmonary effort is normal. No respiratory distress.     Breath sounds: Normal breath sounds. No wheezing.  Abdominal:     General: Bowel sounds are normal. There is no distension.     Palpations: Abdomen is soft.     Tenderness: There is no abdominal tenderness. There is no guarding.  Musculoskeletal:        General: No swelling. Normal range of motion.     Cervical  back: Full passive range of motion without pain, normal range of motion and neck supple. No edema or erythema.     Right lower leg: No edema.     Left lower leg: No edema.     Comments: Right hand- middle finger- small scratch no open wound, mild erythema and swelling, no drainage.  Lymphadenopathy:     Cervical: No cervical adenopathy.  Skin:    General: Skin is warm and dry.     Capillary Refill: Capillary refill takes less than 2 seconds.  Neurological:     General: No focal deficit present.     Mental Status: She is alert and oriented to person, place, and time.     Cranial Nerves: No cranial nerve deficit.     Motor: No weakness.     Coordination: Coordination normal.     Gait: Gait normal.     Deep Tendon Reflexes: Reflexes normal.  Psychiatric:        Mood and Affect: Mood normal.        Behavior: Behavior normal.        Thought Content: Thought content normal.        Judgment: Judgment normal.     No results found  for any visits on 03/29/24.      Assessment & Plan:   Problem List Items Addressed This Visit       Musculoskeletal and Integument   Cat scratch - Primary   Relevant Medications   amoxicillin -clavulanate (AUGMENTIN ) 875-125 MG tablet    Rx-Augmentin   Keep the area clean and dry, wash gently with antibiotic soap and water, and monitor closely for signs of infection. Seek urgent care if you develop increasing redness, swelling, warmth, worsening pain, pus, red streaking, fever, or decreased range of motion. Go to the ER immediately for rapidly spreading infection, severe pain, numbness, or systemic symptoms.  Meds ordered this encounter  Medications   amoxicillin -clavulanate (AUGMENTIN ) 875-125 MG tablet    Sig: Take 1 tablet by mouth 2 (two) times daily.    Dispense:  14 tablet    Refill:  0    Supervising Provider:   DOMENICA BLACKBIRD A [[id number]]    No follow-ups on file.  Harlene LITTIE Jolly, NP   "

## 2024-03-31 ENCOUNTER — Ambulatory Visit
Admission: RE | Admit: 2024-03-31 | Discharge: 2024-03-31 | Disposition: A | Source: Ambulatory Visit | Attending: Family Medicine | Admitting: Family Medicine

## 2024-03-31 DIAGNOSIS — Z1231 Encounter for screening mammogram for malignant neoplasm of breast: Secondary | ICD-10-CM

## 2024-04-05 ENCOUNTER — Ambulatory Visit: Admitting: Podiatry

## 2024-04-05 ENCOUNTER — Ambulatory Visit (INDEPENDENT_AMBULATORY_CARE_PROVIDER_SITE_OTHER)

## 2024-04-05 ENCOUNTER — Encounter: Payer: Self-pay | Admitting: Podiatry

## 2024-04-05 ENCOUNTER — Ambulatory Visit: Payer: Self-pay | Admitting: Podiatry

## 2024-04-05 DIAGNOSIS — M205X1 Other deformities of toe(s) (acquired), right foot: Secondary | ICD-10-CM

## 2024-04-05 DIAGNOSIS — M205X2 Other deformities of toe(s) (acquired), left foot: Secondary | ICD-10-CM

## 2024-04-05 DIAGNOSIS — M21611 Bunion of right foot: Secondary | ICD-10-CM

## 2024-04-05 DIAGNOSIS — M2012 Hallux valgus (acquired), left foot: Secondary | ICD-10-CM | POA: Diagnosis not present

## 2024-04-05 NOTE — Progress Notes (Signed)
"  °  Subjective:  Patient ID: Emily Kirby, female    DOB: 03-Nov-1965,   MRN: [id number]  Chief Complaint  Patient presents with   Foot Pain    I think I have a bunion on my right foot.  I think one may be starting on the left foot.    59 y.o. female presents for concern of right foot bunion as well as maybe starting a bunion on the left foot.  She relates for years she has been dealing with this and recently been getting more more pain in her foot.  She plays a lot of tennis and tends to get more pain after this.  She has tried different shoes and sometimes this has been helping.  She is also used diclofenac  for years and this helps overall with all of her pain.. Denies any other pedal complaints. Denies n/v/f/c.   Past Medical History:  Diagnosis Date   AC (acromioclavicular) joint bone spurs    Allergy    SEASONAL   Chicken pox    Kidney stones    Uterine fibroid     Objective:  Physical Exam: Vascular: DP/PT pulses 2/4 bilateral. CFT <3 seconds. Normal hair growth on digits. No edema.  Skin. No lacerations or abrasions bilateral feet.  Musculoskeletal: MMT 5/5 bilateral lower extremities in DF, PF, Inversion and Eversion. Deceased ROM in DF of ankle joint.  Mild HAV deformity noted.  No major pain to medial eminence bilateral.  Most pain dorsal right first MPJ noted.  Some pain with range of motion of the first MPJ on the right and crepitus noted.  No pain to range of motion on the left foot.  No pain with end range of motion noted. Neurological: Sensation intact to light touch.   Assessment:   1. Hav (hallux abducto valgus), left   2. Bunion of right foot   3. Hallux limitus, right      Plan:  Patient was evaluated and treated and all questions answered. -Xrays reviewed no acute fractures or dislocations noted.  Mild HAV deformity noted.  IM 1 to angle of about 9 degrees.  Loss of joint space noted to first MPJ bilateral worse on the left.  Bipartite sesamoids medial noted  bilateral. -Discussed HAV and hallux limitus and treatment options;conservative and surgical management; risks, benefits, alternatives discussed. All patient's questions answered. -Discussed padding and wide shoe gear.   -Recommend continue with good supportive shoes and inserts.  Discussed custom molded orthotics and will get patient fitted today. -Discussed anti-inflammatories and continue with diclofenac  for now offered injection today patient deferred. -Discussed surgical options.  -Patient to return to office as needed or sooner if condition worsens.   Asberry Failing, DPM    "

## 2024-12-18 ENCOUNTER — Encounter: Admitting: Family Medicine
